# Patient Record
Sex: Male | Born: 1950 | Race: White | Hispanic: No | Marital: Married | Smoking: Former smoker
Health system: Southern US, Community
[De-identification: ages and names within clinical notes are randomized; demographics above are authoritative.]

## PROBLEM LIST (undated history)

## (undated) DIAGNOSIS — E291 Testicular hypofunction: Secondary | ICD-10-CM

## (undated) DIAGNOSIS — Z9889 Other specified postprocedural states: Secondary | ICD-10-CM

## (undated) DIAGNOSIS — J438 Other emphysema: Secondary | ICD-10-CM

## (undated) DIAGNOSIS — N200 Calculus of kidney: Secondary | ICD-10-CM

## (undated) DIAGNOSIS — J45909 Unspecified asthma, uncomplicated: Secondary | ICD-10-CM

## (undated) DIAGNOSIS — F5101 Primary insomnia: Secondary | ICD-10-CM

## (undated) DIAGNOSIS — G4700 Insomnia, unspecified: Secondary | ICD-10-CM

## (undated) DIAGNOSIS — R112 Nausea with vomiting, unspecified: Secondary | ICD-10-CM

## (undated) DIAGNOSIS — E782 Mixed hyperlipidemia: Secondary | ICD-10-CM

## (undated) HISTORY — PX: OTHER SURGICAL HISTORY: SHX169

## (undated) HISTORY — DX: Mixed hyperlipidemia: E78.2

## (undated) HISTORY — DX: Primary insomnia: F51.01

## (undated) HISTORY — DX: Other emphysema: J43.8

## (undated) HISTORY — DX: Testicular hypofunction: E29.1

## (undated) HISTORY — DX: Insomnia, unspecified: G47.00

---

## 2008-06-23 ENCOUNTER — Ambulatory Visit (HOSPITAL_COMMUNITY): Admission: RE | Admit: 2008-06-23 | Discharge: 2008-06-24 | Payer: Self-pay | Admitting: Orthopedic Surgery

## 2010-11-27 NOTE — Op Note (Signed)
[name], [name]             ACCOUNT NO.:  [id number]   MEDICAL RECORD NO.:  [id number]          PATIENT TYPE:  AMB   LOCATION:  DAY                          FACILITY:  [facility]   PHYSICIAN:  Marlowe Kays, M.D.  DATE OF BIRTH:  10-Jun-1951   DATE OF PROCEDURE:  06/23/2008  DATE OF DISCHARGE:                               OPERATIVE REPORT   PREOPERATIVE DIAGNOSES:  1. Completely torn, retracted rotator cuff tear.  2. Advanced hypertrophic arthritis, acromioclavicular joint.  3. Suspected partial biceps and labral tears, left shoulder.   POSTOPERATIVE DIAGNOSES:  1. Completely torn, retracted rotator cuff tear.  2. Advanced hypertrophic arthritis, acromioclavicular joint.  3. Partial biceps and labral tears, left shoulder.   OPERATION:  1. Left shoulder arthroscopy with debridement of small tear of intra-      articular biceps tendon and shaving of labrum.  2. Open distal clavicle resection.  3. Open anterior acromionectomy with repair of torn rotator cuff, see      operative description below for additional details.   PROCEDURE:  Prophylactic antibiotics, satisfactory general anesthesia on  the Allen frame, left shoulder girdle was prepped with DuraPrep, draped  in sterile field.  Time-out performed.  Anatomy of the shoulder joint  was marked out and posterior portal as well as proposed incision for the  distal clavicle resection, rotator cuff repair were all infiltrated 0.5%  Marcaine with adrenalin.  Through a posterior soft-spot portal I  atraumatically entered the glenohumeral joint.  There was a large amount  of joint fluid present as well as a reactive synovitis.  There was a  small area of minimal tear of the biceps tendon and some very slight  labral disruption of the biceps anchor.  He had a good bit of wear of  the humeral head and glenoid and the humeral head was slightly subluxed.  I advanced the scope between the biceps tendon and subscapularis and  using a  switching stick we made an anterior incision over which I placed  a metal cannula and introduced a 4.2 shaver into the joint.  I then  debrided down the small tear in the biceps tendon and the labrum.  Pre  and post films were taken.   I then proceeded to make an open incision over the distal clavicle with  subperiosteal dissection and cutting cautery, demarcated the [facility] joint  and marked 1.5 cm medial to it.  I then undermined the clavicle at this  point and placed baby Hohmann retractors and using a micro-saw, cut the  clavicle.  I then removed it with towel clip and cautery technique.  Small spicules of bone in the parent clavicle I removed with a small  rongeur and rasp and I also smoothed down the underneath surface of the  adjacent acromion.  I then placed bone wax on the raw bone and continued  my incision lateralward, first performed an anterior acromionectomy in  the usual fashion with micro-saw and protecting the underneath surface  with a Cobb elevator.  I then had to remove additional underneath  surface bone for both adequate decompression and also visualization.  I  identified the biceps tendon which was intact with no evidence for any  partial tear.  The rotator cuff tear was then demarcated.   There was a large flexible anterior flap going way back underneath the  acromion in a V shape with a small posterior component which I was able  to free up from the surrounding tissues with hemostat and scissors and  advanced slightly lateralward and anteriorly.  The tear was somewhat  laminated at the apex of the V and I repaired this lamination with  several interrupted horizontal mattress sutures of #1 Ethibond.  I then  used a Stryker 4 stranded anchor lateralward and placed the four strands  in symmetrical fashion along the anterior flap which brought it  posteriorly and then brought them beneath and came from underneath, up  through the posterior flap so I was able to cinch the  two halves  together with what appeared to be a very satisfactory closure, side-to-  side.  I then supplemented this with multiple interrupted sutures from  the remaining strands of the anchor.  I also supplemented the repair  lateralward.  This seemed to give a very nice stable repair with his arm  to his side.  I took pictures with the scope along the way.   The wound was then well-irrigated with sterile saline and once again I  infiltrated the two portals and the region of the wound  itself with  0.5% Marcaine with adrenalin.  The two portals were closed with 4-0  nylon.  I reapproximated the slight split in the deltoid muscle and the  fascia over the distal clavicle resection and over the rotator cuff with  interrupted #1 Vicryl.  Subcutaneous tissue was closed with a  combination of #1 and 2-0 Vicryl with Steri-Strips on skin.  Dry sterile  dressing was applied to all wounds with Betadine and Adaptic to the  portals.  He was then placed in a shoulder immobilizer, tolerated the  procedure well, was taken to recovery in satisfactory condition with no  known complications and minimal blood loss.           ______________________________  Marlowe Kays, M.D.     JA/MEDQ  D:  06/23/2008  T:  06/24/2008  Job:  [id number]

## 2012-05-28 ENCOUNTER — Other Ambulatory Visit: Payer: Self-pay | Admitting: Orthopedic Surgery

## 2012-06-02 ENCOUNTER — Encounter (HOSPITAL_COMMUNITY): Payer: Self-pay | Admitting: Pharmacy Technician

## 2012-06-05 ENCOUNTER — Encounter (HOSPITAL_COMMUNITY)
Admission: RE | Admit: 2012-06-05 | Discharge: 2012-06-05 | Disposition: A | Discharge status: Home / Self Care | Payer: PRIVATE HEALTH INSURANCE | Source: Ambulatory Visit | Attending: Orthopedic Surgery | Admitting: Orthopedic Surgery

## 2012-06-05 ENCOUNTER — Encounter (HOSPITAL_COMMUNITY): Payer: Self-pay

## 2012-06-05 HISTORY — DX: Unspecified asthma, uncomplicated: J45.909

## 2012-06-05 HISTORY — DX: Other specified postprocedural states: Z98.890

## 2012-06-05 HISTORY — DX: Calculus of kidney: N20.0

## 2012-06-05 HISTORY — DX: Other specified postprocedural states: R11.2

## 2012-06-05 LAB — CBC
MCH: 29.5 pg (ref 26.0–34.0)
MCV: 85.7 fL (ref 78.0–100.0)
Platelets: 323 10*3/uL (ref 150–400)
RBC: 5.02 MIL/uL (ref 4.22–5.81)

## 2012-06-05 NOTE — Progress Notes (Signed)
Note routed by epic to Dr. Simonne Come, ancef ordered and patient has penicillin allergy.

## 2012-06-05 NOTE — Progress Notes (Signed)
Pt states unknown reaction to penicillin as child, ancef is ordered. Do you wish to change this?

## 2012-06-05 NOTE — Progress Notes (Signed)
Nurse Zella Ball from Dr. Leim Fabry office called, no change in antibiotic.

## 2012-06-05 NOTE — Patient Instructions (Addendum)
20 JOSEGUADALUPE STAN  06/05/2012   Your procedure is scheduled on:  06/10/12   Wednesday   Surgery 1030-1230  Report to [facility] at  0830     AM.  Call this number if you have problems the morning of surgery: [phone]      Remember: Marla Roe WITH YOU TO HOSPITAL  Do not eat food  Or drink :After Midnight.  Tuesday NIGHT   Take these medicines the morning of surgery with A SIP OF WATER:albuterol inhaler if needed and bring inhaler with you.  .  Contacts, dentures or partial plates can not be worn to surgery  Leave suitcase in the car. After surgery it may be brought to your room.  For patients admitted to the hospital, checkout time is 11:00 AM day of  discharge.             SPECIAL INSTRUCTIONS- SEE [facility] PREPARING FOR SURGERY INSTRUCTION SHEET-     DO NOT WEAR JEWELRY, LOTIONS, POWDERS, OR PERFUMES.  WOMEN-- DO NOT SHAVE LEGS OR UNDERARMS FOR 12 HOURS BEFORE SHOWERS. MEN MAY SHAVE FACE.                                                                            Please read over the following fact sheets that you were given: MRSA Information, Incentive Spirometry Sheet, Blood Transfusion Sheet  Information                                                                                   Fleet Contras Rashan Patient  PST [phone]  [phone]

## 2012-06-10 ENCOUNTER — Encounter (HOSPITAL_COMMUNITY): Payer: Self-pay | Admitting: *Deleted

## 2012-06-10 ENCOUNTER — Encounter (HOSPITAL_COMMUNITY): Payer: Self-pay | Admitting: Anesthesiology

## 2012-06-10 ENCOUNTER — Encounter (HOSPITAL_COMMUNITY): Payer: Self-pay

## 2012-06-10 ENCOUNTER — Ambulatory Visit (HOSPITAL_COMMUNITY)
Admission: RE | Admit: 2012-06-10 | Discharge: 2012-06-11 | Disposition: A | Discharge status: Home / Self Care | Payer: PRIVATE HEALTH INSURANCE | Source: Ambulatory Visit | Attending: Orthopedic Surgery | Admitting: Orthopedic Surgery

## 2012-06-10 ENCOUNTER — Ambulatory Visit (HOSPITAL_COMMUNITY): Payer: PRIVATE HEALTH INSURANCE | Admitting: Anesthesiology

## 2012-06-10 ENCOUNTER — Encounter (HOSPITAL_COMMUNITY)
Admission: RE | Disposition: A | Discharge status: Home / Self Care | Payer: Self-pay | Source: Ambulatory Visit | Attending: Orthopedic Surgery

## 2012-06-10 DIAGNOSIS — M719 Bursopathy, unspecified: Secondary | ICD-10-CM | POA: Insufficient documentation

## 2012-06-10 DIAGNOSIS — M67919 Unspecified disorder of synovium and tendon, unspecified shoulder: Secondary | ICD-10-CM | POA: Insufficient documentation

## 2012-06-10 DIAGNOSIS — M24119 Other articular cartilage disorders, unspecified shoulder: Secondary | ICD-10-CM | POA: Insufficient documentation

## 2012-06-10 DIAGNOSIS — Z79899 Other long term (current) drug therapy: Secondary | ICD-10-CM | POA: Insufficient documentation

## 2012-06-10 DIAGNOSIS — J45909 Unspecified asthma, uncomplicated: Secondary | ICD-10-CM | POA: Insufficient documentation

## 2012-06-10 DIAGNOSIS — Z01812 Encounter for preprocedural laboratory examination: Secondary | ICD-10-CM | POA: Insufficient documentation

## 2012-06-10 DIAGNOSIS — Z01810 Encounter for preprocedural cardiovascular examination: Secondary | ICD-10-CM | POA: Insufficient documentation

## 2012-06-10 DIAGNOSIS — M75101 Unspecified rotator cuff tear or rupture of right shoulder, not specified as traumatic: Secondary | ICD-10-CM

## 2012-06-10 HISTORY — PX: SHOULDER ARTHROSCOPY WITH OPEN ROTATOR CUFF REPAIR AND DISTAL CLAVICLE ACROMINECTOMY: SHX5683

## 2012-06-10 SURGERY — SHOULDER ARTHROSCOPY WITH OPEN ROTATOR CUFF REPAIR AND DISTAL CLAVICLE ACROMINECTOMY
Anesthesia: Regional | Site: Shoulder | Laterality: Right | Wound class: Clean

## 2012-06-10 MED ORDER — GLYCOPYRROLATE 0.2 MG/ML IJ SOLN
INTRAMUSCULAR | Status: DC | PRN
  Administered 2012-06-10: 0.4 mg via INTRAVENOUS

## 2012-06-10 MED ORDER — CEFAZOLIN SODIUM-DEXTROSE 2-3 GM-% IV SOLR
2.0000 g | INTRAVENOUS | Status: AC
  Administered 2012-06-10: 2 g via INTRAVENOUS

## 2012-06-10 MED ORDER — DEXAMETHASONE SODIUM PHOSPHATE 10 MG/ML IJ SOLN
INTRAMUSCULAR | Status: DC | PRN
  Administered 2012-06-10: 10 mg via INTRAVENOUS

## 2012-06-10 MED ORDER — LACTATED RINGERS IV SOLN
INTRAVENOUS | Status: DC
  Administered 2012-06-10: 1000 mL via INTRAVENOUS
  Administered 2012-06-10: 12:00:00 via INTRAVENOUS

## 2012-06-10 MED ORDER — METOCLOPRAMIDE HCL 10 MG PO TABS: 5.0000 mg | ORAL_TABLET | Freq: Three times a day (TID) | ORAL | Status: DC | PRN

## 2012-06-10 MED ORDER — OXYCODONE-ACETAMINOPHEN 5-325 MG PO TABS
1.0000 | ORAL_TABLET | ORAL | Status: DC | PRN
  Administered 2012-06-10 – 2012-06-11 (×2): 1 via ORAL
  Filled 2012-06-10 (×4): qty 1

## 2012-06-10 MED ORDER — MIDAZOLAM HCL 2 MG/2ML IJ SOLN
INTRAMUSCULAR | Status: AC
  Filled 2012-06-10: qty 2

## 2012-06-10 MED ORDER — CEFAZOLIN SODIUM-DEXTROSE 2-3 GM-% IV SOLR
INTRAVENOUS | Status: AC
  Filled 2012-06-10: qty 50

## 2012-06-10 MED ORDER — SCOPOLAMINE 1 MG/3DAYS TD PT72
MEDICATED_PATCH | TRANSDERMAL | Status: AC
  Filled 2012-06-10: qty 1

## 2012-06-10 MED ORDER — ONDANSETRON HCL 4 MG PO TABS: 4.0000 mg | ORAL_TABLET | Freq: Four times a day (QID) | ORAL | Status: DC | PRN

## 2012-06-10 MED ORDER — LIDOCAINE HCL (CARDIAC) 20 MG/ML IV SOLN
INTRAVENOUS | Status: DC | PRN
  Administered 2012-06-10: 50 mg via INTRAVENOUS

## 2012-06-10 MED ORDER — DEXTROSE-NACL 5-0.45 % IV SOLN
INTRAVENOUS | Status: DC
  Administered 2012-06-10 (×2): via INTRAVENOUS

## 2012-06-10 MED ORDER — MIDAZOLAM HCL 2 MG/2ML IJ SOLN
1.0000 mg | INTRAMUSCULAR | Status: DC | PRN
  Administered 2012-06-10: 2 mg via INTRAVENOUS

## 2012-06-10 MED ORDER — ACETAMINOPHEN 10 MG/ML IV SOLN
INTRAVENOUS | Status: AC
  Filled 2012-06-10: qty 100

## 2012-06-10 MED ORDER — ACETAMINOPHEN 10 MG/ML IV SOLN
INTRAVENOUS | Status: DC | PRN
  Administered 2012-06-10: 1000 mg via INTRAVENOUS

## 2012-06-10 MED ORDER — EPINEPHRINE HCL 1 MG/ML IJ SOLN
INTRAMUSCULAR | Status: AC
  Filled 2012-06-10: qty 2

## 2012-06-10 MED ORDER — EPINEPHRINE HCL 1 MG/ML IJ SOLN
INTRAMUSCULAR | Status: DC | PRN
  Administered 2012-06-10: 1 mg

## 2012-06-10 MED ORDER — POVIDONE-IODINE 7.5 % EX SOLN: Freq: Once | CUTANEOUS | Status: DC

## 2012-06-10 MED ORDER — ALBUTEROL SULFATE HFA 108 (90 BASE) MCG/ACT IN AERS: 1.0000 | INHALATION_SPRAY | Freq: Four times a day (QID) | RESPIRATORY_TRACT | Status: DC | PRN

## 2012-06-10 MED ORDER — FENTANYL CITRATE 0.05 MG/ML IJ SOLN
100.0000 ug | INTRAMUSCULAR | Status: DC | PRN
  Administered 2012-06-10: 100 ug via INTRAVENOUS

## 2012-06-10 MED ORDER — PHENYLEPHRINE HCL 10 MG/ML IJ SOLN
INTRAMUSCULAR | Status: DC | PRN
  Administered 2012-06-10 (×4): 80 ug via INTRAVENOUS

## 2012-06-10 MED ORDER — SUCCINYLCHOLINE CHLORIDE 20 MG/ML IJ SOLN
INTRAMUSCULAR | Status: DC | PRN
  Administered 2012-06-10: 100 mg via INTRAVENOUS

## 2012-06-10 MED ORDER — ROPIVACAINE HCL 5 MG/ML IJ SOLN: INTRAMUSCULAR | Status: DC | PRN

## 2012-06-10 MED ORDER — HETASTARCH-ELECTROLYTES 6 % IV SOLN
INTRAVENOUS | Status: DC | PRN
  Administered 2012-06-10: 11:00:00 via INTRAVENOUS

## 2012-06-10 MED ORDER — METHOCARBAMOL 500 MG PO TABS
500.0000 mg | ORAL_TABLET | Freq: Four times a day (QID) | ORAL | Status: DC | PRN
  Administered 2012-06-11: 500 mg via ORAL
  Filled 2012-06-10: qty 1

## 2012-06-10 MED ORDER — PROMETHAZINE HCL 25 MG/ML IJ SOLN: 6.2500 mg | INTRAMUSCULAR | Status: DC | PRN

## 2012-06-10 MED ORDER — LACTATED RINGERS IR SOLN
Status: DC | PRN
  Administered 2012-06-10 (×2): 3000 mL

## 2012-06-10 MED ORDER — LIDOCAINE HCL 4 % MT SOLN
OROMUCOSAL | Status: DC | PRN
  Administered 2012-06-10: 4 mL via TOPICAL

## 2012-06-10 MED ORDER — ROPIVACAINE HCL 5 MG/ML IJ SOLN
INTRAMUSCULAR | Status: DC | PRN
  Administered 2012-06-10: 25 mL via EPIDURAL

## 2012-06-10 MED ORDER — ONDANSETRON HCL 4 MG/2ML IJ SOLN: 4.0000 mg | Freq: Four times a day (QID) | INTRAMUSCULAR | Status: DC | PRN

## 2012-06-10 MED ORDER — ROPIVACAINE HCL 5 MG/ML IJ SOLN
INTRAMUSCULAR | Status: AC
  Filled 2012-06-10: qty 30

## 2012-06-10 MED ORDER — ONDANSETRON HCL 4 MG/2ML IJ SOLN
INTRAMUSCULAR | Status: DC | PRN
  Administered 2012-06-10: 4 mg via INTRAVENOUS

## 2012-06-10 MED ORDER — BUPIVACAINE-EPINEPHRINE 0.5% -1:200000 IJ SOLN
INTRAMUSCULAR | Status: DC | PRN
  Administered 2012-06-10: 30 mL

## 2012-06-10 MED ORDER — FENTANYL CITRATE 0.05 MG/ML IJ SOLN
INTRAMUSCULAR | Status: AC
  Filled 2012-06-10: qty 2

## 2012-06-10 MED ORDER — LACTATED RINGERS IV SOLN: INTRAVENOUS | Status: DC

## 2012-06-10 MED ORDER — HYDROMORPHONE HCL PF 1 MG/ML IJ SOLN: 0.2500 mg | INTRAMUSCULAR | Status: DC | PRN

## 2012-06-10 MED ORDER — NEOSTIGMINE METHYLSULFATE 1 MG/ML IJ SOLN
INTRAMUSCULAR | Status: DC | PRN
  Administered 2012-06-10: 3 mg via INTRAVENOUS

## 2012-06-10 MED ORDER — MORPHINE SULFATE 2 MG/ML IJ SOLN: 1.0000 mg | INTRAMUSCULAR | Status: DC | PRN

## 2012-06-10 MED ORDER — PROPOFOL 10 MG/ML IV BOLUS
INTRAVENOUS | Status: DC | PRN
  Administered 2012-06-10: 160 mg via INTRAVENOUS
  Administered 2012-06-10: 40 mg via INTRAVENOUS

## 2012-06-10 MED ORDER — BUPIVACAINE-EPINEPHRINE (PF) 0.5% -1:200000 IJ SOLN
INTRAMUSCULAR | Status: AC
  Filled 2012-06-10: qty 10

## 2012-06-10 MED ORDER — METOCLOPRAMIDE HCL 5 MG/ML IJ SOLN: 5.0000 mg | Freq: Three times a day (TID) | INTRAMUSCULAR | Status: DC | PRN

## 2012-06-10 MED ORDER — ROCURONIUM BROMIDE 100 MG/10ML IV SOLN
INTRAVENOUS | Status: DC | PRN
  Administered 2012-06-10: 5 mg via INTRAVENOUS

## 2012-06-10 MED ORDER — CEFAZOLIN SODIUM-DEXTROSE 2-3 GM-% IV SOLR
2.0000 g | Freq: Four times a day (QID) | INTRAVENOUS | Status: AC
  Administered 2012-06-10 – 2012-06-11 (×3): 2 g via INTRAVENOUS
  Filled 2012-06-10 (×3): qty 50

## 2012-06-10 MED ORDER — METHOCARBAMOL 100 MG/ML IJ SOLN
500.0000 mg | Freq: Four times a day (QID) | INTRAVENOUS | Status: DC | PRN
  Administered 2012-06-10: 500 mg via INTRAVENOUS
  Filled 2012-06-10: qty 5

## 2012-06-10 MED ORDER — SUFENTANIL CITRATE 50 MCG/ML IV SOLN
INTRAVENOUS | Status: DC | PRN
  Administered 2012-06-10 (×2): 10 ug via INTRAVENOUS

## 2012-06-10 SURGICAL SUPPLY — 52 items
ANCHOR PEEK ZIP 5.5 NDL NO2 (Orthopedic Implant) ×2 IMPLANT
BENZOIN TINCTURE PRP APPL 2/3 (GAUZE/BANDAGES/DRESSINGS) ×2 IMPLANT
BLADE 4.2CUDA (BLADE) ×2 IMPLANT
BLADE OSCILLATING/SAGITTAL (BLADE) ×1
BLADE SURG SZ11 CARB STEEL (BLADE) ×2 IMPLANT
BLADE SW THK.38XMED LNG THN (BLADE) ×1 IMPLANT
BOOTIES KNEE HIGH SLOAN (MISCELLANEOUS) ×2 IMPLANT
BUR OVAL 4.0 (BURR) ×4 IMPLANT
CLOTH BEACON ORANGE TIMEOUT ST (SAFETY) ×2 IMPLANT
DRAPE LG THREE QUARTER DISP (DRAPES) ×2 IMPLANT
DRAPE SHOULDER BEACH CHAIR (DRAPES) ×2 IMPLANT
DRAPE U-SHAPE 47X51 STRL (DRAPES) ×2 IMPLANT
DRSG ADAPTIC 3X8 NADH LF (GAUZE/BANDAGES/DRESSINGS) ×2 IMPLANT
DRSG EMULSION OIL 3X16 NADH (GAUZE/BANDAGES/DRESSINGS) ×2 IMPLANT
DRSG PAD ABDOMINAL 8X10 ST (GAUZE/BANDAGES/DRESSINGS) ×2 IMPLANT
DURAPREP 26ML APPLICATOR (WOUND CARE) ×2 IMPLANT
ELECT KIT MENISCUS BASIC 165 (SET/KITS/TRAYS/PACK) IMPLANT
ELECT REM PT RETURN 9FT ADLT (ELECTROSURGICAL) ×2
ELECTRODE REM PT RTRN 9FT ADLT (ELECTROSURGICAL) ×1 IMPLANT
GLOVE BIOGEL PI IND STRL 8 (GLOVE) ×1 IMPLANT
GLOVE BIOGEL PI INDICATOR 8 (GLOVE) ×1
GLOVE ECLIPSE 8.0 STRL XLNG CF (GLOVE) ×4 IMPLANT
GLOVE INDICATOR 8.0 STRL GRN (GLOVE) ×4 IMPLANT
GOWN STRL REIN XL XLG (GOWN DISPOSABLE) ×4 IMPLANT
KIT BASIN OR (CUSTOM PROCEDURE TRAY) ×2 IMPLANT
KIT POSITION SHOULDER SCHLEI (MISCELLANEOUS) ×2 IMPLANT
MANIFOLD NEPTUNE II (INSTRUMENTS) ×2 IMPLANT
NDL SAFETY ECLIPSE 18X1.5 (NEEDLE) ×1 IMPLANT
NEEDLE HYPO 18GX1.5 SHARP (NEEDLE) ×1
NEEDLE MA TROC 1/2 (NEEDLE) ×2 IMPLANT
NEEDLE MA TROC 1/2 CIR (NEEDLE) IMPLANT
NEEDLE SPNL 18GX3.5 QUINCKE PK (NEEDLE) ×2 IMPLANT
PACK SHOULDER CUSTOM OPM052 (CUSTOM PROCEDURE TRAY) ×2 IMPLANT
POSITIONER SURGICAL ARM (MISCELLANEOUS) ×4 IMPLANT
SET ARTHROSCOPY TUBING (MISCELLANEOUS) ×1
SET ARTHROSCOPY TUBING LN (MISCELLANEOUS) ×1 IMPLANT
SLING ARM IMMOBILIZER LRG (SOFTGOODS) ×2 IMPLANT
SLING ARM IMMOBILIZER MED (SOFTGOODS) IMPLANT
SPONGE GAUZE 4X4 12PLY (GAUZE/BANDAGES/DRESSINGS) ×2 IMPLANT
STAPLER VISISTAT (STAPLE) ×2 IMPLANT
STRIP CLOSURE SKIN 1/2X4 (GAUZE/BANDAGES/DRESSINGS) IMPLANT
SUCTION FRAZIER TIP 10 FR DISP (SUCTIONS) ×2 IMPLANT
SUT BONE WAX W31G (SUTURE) ×2 IMPLANT
SUT ETHIBOND NAB CT1 #1 30IN (SUTURE) ×2 IMPLANT
SUT ETHILON 4 0 PS 2 18 (SUTURE) ×2 IMPLANT
SUT VIC AB 1 CT1 27 (SUTURE)
SUT VIC AB 1 CT1 27XBRD ANTBC (SUTURE) IMPLANT
SUT VIC AB 2-0 CT1 27 (SUTURE) ×1
SUT VIC AB 2-0 CT1 27XBRD (SUTURE) ×1 IMPLANT
SYR 3ML LL SCALE MARK (SYRINGE) ×2 IMPLANT
TUBING CONNECTING 10 (TUBING) ×2 IMPLANT
WAND 90 DEG TURBOVAC W/CORD (SURGICAL WAND) ×2 IMPLANT

## 2012-06-10 NOTE — Brief Op Note (Signed)
06/10/2012  2:12 PM  PATIENT:  Mechele Collin  61 y.o. male  PRE-OPERATIVE DIAGNOSIS:  RIGHT SHOULDER REOCCURRENCE ROTATOR CUFF TEAR  POST-OPERATIVE DIAGNOSIS:  labral and rotator cuff tear right shoulder  PROCEDURE:  Procedure(s) (LRB) with comments: SHOULDER ARTHROSCOPY WITH OPEN ROTATOR CUFF REPAIR AND DISTAL CLAVICLE ACROMINECTOMY (Right) - LABRAL DEBRIDEMENT OPEN ANTERIOR ACROMINECTOMY/ROTATOR CUFF REPAIR with anchor  SURGEON:  Surgeon(s) and Role:    * Drucilla Schmidt, MD - Primary  PHYSICIAN ASSISTANT: Skip Mayer [facility]  ASSISTANTS:nurse  ANESTHESIA:   regional and general  EBL:  Total I/O In: 2450 [I.V.:1900; IV Piggyback:550] Out: 50 [Blood:50]  BLOOD ADMINISTERED:none  DRAINS: none   LOCAL MEDICATIONS USED:  XYLOCAINE   SPECIMEN:  No Specimen  DISPOSITION OF SPECIMEN:  N/A  COUNTS:  YES  TOURNIQUET:  * No tourniquets in log *  DICTATION: .Other Dictation: Dictation Number [id number]  PLAN OF CARE: Admit for overnight observation  PATIENT DISPOSITION:  PACU - hemodynamically stable.   Delay start of Pharmacological VTE agent (>24hrs) due to surgical blood loss or risk of bleeding: yes

## 2012-06-10 NOTE — Anesthesia Procedure Notes (Signed)
Anesthesia Regional Block:  Interscalene brachial plexus block  Pre-Anesthetic Checklist: ,, timeout performed, Correct Patient, Correct Site, Correct Laterality, Correct Procedure, Correct Position, site marked, Risks and benefits discussed,  Surgical consent,  Pre-op evaluation,  At surgeon's request and post-op pain management  Laterality: Right  Prep: chloraprep       Needles:  Injection technique: Single-shot  Needle Type: Stimiplex          Additional Needles:  Procedures: ultrasound guided (picture in chart) Interscalene brachial plexus block Narrative:  Start time: 06/10/2012 10:05 AM End time: 06/10/2012 10:12 AM Injection made incrementally with aspirations every 5 mL.  Performed by: Personally  Anesthesiologist: A.Obediah Welles MD  Interscalene brachial plexus block

## 2012-06-10 NOTE — Anesthesia Postprocedure Evaluation (Signed)
Anesthesia Post Note  Patient: [name]  Procedure(s) Performed: Procedure(s) (LRB): SHOULDER ARTHROSCOPY WITH OPEN ROTATOR CUFF REPAIR AND DISTAL CLAVICLE ACROMINECTOMY (Right)  Anesthesia type: General  Patient location: PACU  Post pain: Pain level controlled  Post assessment: Post-op Vital signs reviewed  Last Vitals:  Filed Vitals:   06/10/12 1345  BP: 127/73  Pulse: 88  Temp:   Resp: 22    Post vital signs: Reviewed  Level of consciousness: sedated  Complications: No apparent anesthesia complications

## 2012-06-10 NOTE — H&P (Signed)
[name] is an 61 y.o. male.   Chief Complaint:painful rt shoulder HPI:MRI demonstrates torn labrum and torn retracted rotator cuff tear  Past Medical History  Diagnosis Date  . Asthma     mild, no recent flare ups  . Kidney stones 13 yrs ago  . PONV (postoperative nausea and vomiting)     Past Surgical History  Procedure Date  . Bilateral rotator cuff repair right 2004, left 2009  . Left ankle fx x 2     History reviewed. No pertinent family history. Social History:  reports that he has quit smoking. His smoking use included Cigarettes. He has a .75 pack-year smoking history. He has never used smokeless tobacco. He reports that he drinks alcohol. He reports that he does not use illicit drugs.  Allergies:  Allergies  Allergen Reactions  . Penicillins Other (See Comments)    Unknown reaction,    Medications Prior to Admission  Medication Sig Dispense Refill  . amoxicillin-clavulanate (AUGMENTIN) 125-31.25 MG/5ML suspension Take by mouth 2 (two) times daily.      Marland Kitchen albuterol (PROVENTIL HFA;VENTOLIN HFA) 108 (90 BASE) MCG/ACT inhaler Inhale 1 puff into the lungs every 6 (six) hours as needed. Wheezing and shortness of breath      . glucosamine-chondroitin 500-400 MG tablet Take 1 tablet by mouth 3 (three) times daily.      Marland Kitchen lovastatin (MEVACOR) 20 MG tablet Take 20 mg by mouth at bedtime.      . Multiple Vitamin (MULTIVITAMIN WITH MINERALS) TABS Take 1 tablet by mouth daily.      . Omega-3 Fatty Acids (FISH OIL) 1200 MG CAPS Take 1 capsule by mouth daily.      . vitamin C (ASCORBIC ACID) 500 MG tablet Take 500 mg by mouth daily.      . vitamin E (VITAMIN E) 400 UNIT capsule Take 400 Units by mouth daily.        No results found for this or any previous visit (from the past 48 hour(s)). No results found.  ROS  Blood pressure 113/75, pulse 69, temperature 97.9 F (36.6 C), temperature source Oral, resp. rate 18, SpO2 99.00%. Physical Exam  Constitutional: He is  oriented to person, place, and time. He appears well-developed and well-nourished.  HENT:  Head: Normocephalic and atraumatic.  Right Ear: External ear normal.  Left Ear: External ear normal.  Nose: Nose normal.  Mouth/Throat: Oropharynx is clear and moist.  Eyes: Conjunctivae normal and EOM are normal. Pupils are equal, round, and reactive to light.  Neck: Normal range of motion. Neck supple.  Cardiovascular: Normal rate, regular rhythm, normal heart sounds and intact distal pulses.   Respiratory: Effort normal and breath sounds normal.  GI: Soft. Bowel sounds are normal.  Musculoskeletal: Normal range of motion. He exhibits tenderness.       Tender rt shoulder  Neurological: He is alert and oriented to person, place, and time. He has normal reflexes.  Skin: Skin is warm and dry.  Psychiatric: He has a normal mood and affect. His behavior is normal. Judgment and thought content normal.     Assessment/Plan Torn labrum and rotator cuff rt shoulder Rt shoulder arthroscopy with labral debridement and open anterior acromionectomy with rotator cuff repair  [name] P 06/10/2012, 9:51 AM

## 2012-06-10 NOTE — Anesthesia Preprocedure Evaluation (Signed)
Anesthesia Evaluation    History of Anesthesia Complications (+) PONV  Airway Mallampati: II TM Distance: >3 FB Neck ROM: Full    Dental  (+) Teeth Intact and Dental Advisory Given   Pulmonary asthma , former smoker,  breath sounds clear to auscultation  Pulmonary exam normal       Cardiovascular Rhythm:Regular Rate:Normal     Neuro/Psych    GI/Hepatic   Endo/Other  Morbid obesity  Renal/GU Renal disease     Musculoskeletal   Abdominal   Peds  Hematology   Anesthesia Other Findings   Reproductive/Obstetrics                           Anesthesia Physical Anesthesia Plan  ASA: II  Anesthesia Plan: General and Regional   Post-op Pain Management:    Induction: Intravenous  Airway Management Planned: Oral ETT  Additional Equipment:   Intra-op Plan:   Post-operative Plan: Extubation in OR  Informed Consent: I have reviewed the patients History and Physical, chart, labs and discussed the procedure including the risks, benefits and alternatives for the proposed anesthesia with the patient or authorized representative who has indicated his/her understanding and acceptance.   Dental advisory given  Plan Discussed with: CRNA  Anesthesia Plan Comments:         Anesthesia Quick Evaluation

## 2012-06-10 NOTE — Transfer of Care (Signed)
Immediate Anesthesia Transfer of Care Note  Patient: [name]  Procedure(s) Performed: Procedure(s) (LRB) with comments: SHOULDER ARTHROSCOPY WITH OPEN ROTATOR CUFF REPAIR AND DISTAL CLAVICLE ACROMINECTOMY (Right) - LABRAL DEBRIDEMENT OPEN ANTERIOR ACROMINECTOMY/ROTATOR CUFF REPAIR with anchor  Patient Location: PACU  Anesthesia Type:General  Level of Consciousness: awake, alert , oriented, patient cooperative and responds to stimulation  Airway & Oxygen Therapy: Patient Spontanous Breathing and Patient connected to face mask oxygen  Post-op Assessment: Report given to PACU RN, Post -op Vital signs reviewed and stable and Patient moving all extremities X 4  Post vital signs: stable  Complications: No apparent anesthesia complications

## 2012-06-11 MED ORDER — HYDROCODONE-ACETAMINOPHEN 10-325 MG PO TABS: 1.0000 | ORAL_TABLET | Freq: Four times a day (QID) | ORAL | Status: DC | PRN

## 2012-06-11 MED ORDER — METHOCARBAMOL 500 MG PO TABS: 500.0000 mg | ORAL_TABLET | Freq: Four times a day (QID) | ORAL | Status: DC | PRN

## 2012-06-11 NOTE — Op Note (Signed)
[name], [name]             ACCOUNT NO.:  [id number]  MEDICAL RECORD NO.:  [id number]  LOCATION:  1614                         FACILITY:  [facility]  PHYSICIAN:  Marlowe Kays, M.D.  DATE OF BIRTH:  1951-04-07  DATE OF PROCEDURE:  06/10/2012 DATE OF DISCHARGE:                              OPERATIVE REPORT   PREOPERATIVE DIAGNOSES: 1. Recurrent rotator cuff tear. 2. Labral degenerative tear, right shoulder.  POSTOPERATIVE DIAGNOSES: 1. Recurrent rotator cuff tear. 2. Labral degenerative tear, right shoulder.  OPERATION: 1. Right shoulder arthroscopy with debridement of labrum and articular     surface of rotator cuff. 2. Open anterior acromionectomy with repair of recurrent rotator cuff     tear.  SURGEON:  Marlowe Kays, M.D.  ASSISTANTLaural Benes. Jannet Mantis.  ANESTHESIA:  Interscalene block followed by general anesthesia.  PATHOLOGY AND JUSTIFICATION FOR PROCEDURE:  Original repair with some 9 years ago elsewhere and he has done well until recently when after working out with weights and noted progressive pain in his right shoulder.  An MRI demonstrated a complete rotator cuff tear with roughly 5.5 cm of tendon retraction.  PROCEDURE:  Prophylactic antibiotics, satisfied general anesthesia after anesthesia had performed and interscalene block, placed on the sliding frame in the beach-chair position.  Right shoulder girdle was prepped with DuraPrep, draped in sterile field.  Time-out was performed. Anatomy of the shoulder joint was marked out and site for the posterior portal and the skin incision were injected with Marcaine with adrenaline for hemostatic purposes.  Through posterior soft spot portal, I atraumatically entered the glenohumeral joint with findings good bit of fraying of the rotator cuff and the degenerative tearing of the labrum, particularly at the level of the biceps anchor.  The biceps itself appeared to have some disruption with healing in the  past, but was intact.  I advanced the scope between the biceps tendon and subscapularis, and using a switching stick, made an anterior incision, over which, I placed a metal cannula over the switching stick and entered into the joint anteriorly.  I used a 4.2 shaver to debride down the labrum and trim up the rotator cuff fibers, which may have been affecting the joint mechanics.  Pre and post-films were taken.  I then removed all fluid from the glenohumeral joint and placed a staples in the two portals.  I then made an open incision over the anterior acromion and with cautery subperiosteal dissection.  I isolated the anterior acromion.  I placed a small Cobb elevator followed by larger Cobb elevator beneath the acromion and then made my initial anterior acromionectomy with a subsequent second minor cut of a few millimeter to allow for more exposure and decompression.  He had very thickened bursa, which I excised and allow me to confirm the tear noted on the MRI. Biceps tendon was noted to be intact.  There was a residual portion of the suture, which may have been what the radiologist felt was a small foreign body or ganglion at the infraspinatus myotendinous junction. After freeing up the rotator cuff, it was not only V shaved, but had laminated, so that large anterior flap had migrated anteriorly. Accordingly my first step  was to try and reestablish the 2 layers of rotator cuff, which had split and I brought the anterior flap posteriorly and sutured it down with two separate #1 Ethibond sutures. I then used a 4-stranded Stryker anchor after roughening up the greater tuberosity area and was able to spliced the 4 strands across the length of the tear, cinching it down laterally.  I then had terminal sutures as well with the same suture material, which really nicely tagged down the terminal end of the rotator cuff and this seemed to be stable with his arm to the side.  There was no residual  impingement on closure.  The wound was irrigated with sterile saline and I closed the fascia over the anterior acromion and the deltoid muscle with interrupted #1 Vicryl, subcutaneous tissue with 2-0 Vicryl, staples in the skin.  Betadine, Adaptic, dry sterile dressing, and shoulder immobilizer were applied. He tolerated the procedure well, was taken to the recovery room in satisfied condition with no known complications.          ______________________________ Marlowe Kays, M.D.     JA/MEDQ  D:  06/10/2012  T:  06/11/2012  Job:  [id number]

## 2012-06-11 NOTE — Progress Notes (Signed)
Patient ID: [name], male   DOB: 1951/01/12, 61 y.o.   MRN: [id number] Pod 1--block still partly in place.  He is urinating and is comfortable.  Plan dish today.  Office 12/2.

## 2012-06-16 NOTE — Progress Notes (Signed)
Utilization review completed.  

## 2012-06-18 NOTE — Discharge Summary (Signed)
[name], [name]             ACCOUNT NO.:  [id number]  MEDICAL RECORD NO.:  [id number]  LOCATION:  1614                         FACILITY:  [facility]  PHYSICIAN:  Marlowe Kays, M.D.  DATE OF BIRTH:  [birth year]  DATE OF ADMISSION:  06/10/2012 DATE OF DISCHARGE:  06/11/2012                              DISCHARGE SUMMARY   ADMITTING DIAGNOSES: 1. Recurrent rotator cuff tear, right shoulder. 2. Labral degenerative tear, right shoulder.  DISCHARGE DIAGNOSES: 1. Recurrent rotator cuff tear, right shoulder. 2. Labral degenerative tear, right shoulder.  OPERATION: 1. Right shoulder arthroscopy with debridement of labrum and articular     surface of rotator cuff. 2. Open anterior acromionectomy with repair of recurrent rotator cuff     tear.  SUMMARY:  This man is a longstanding patient of mine, I have done previous rotator cuff repair on his left shoulder.  Some 9 years ago, he had rotator cuff repair on his right shoulder elsewhere and recently has had working out with weights, progressive pain with an MRI demonstrating complete rotator cuff tear with retraction and a degenerative labral tear.  Accordingly, he is here for the above-mentioned surgery.  This was performed under general anesthesia supplemented with an interscalene block.  He had no postoperative complications and he was able to be discharge the next day, afebrile, with the interscalene block still slightly effective.  He was given prescriptions for Demerol and Robaxin, return to my office in 4-5 days for removal of dressing and to begin Betadine.  Condition at discharge was stable and improved.          ______________________________ Marlowe Kays, M.D.     JA/MEDQ  D:  06/17/2012  T:  06/17/2012  Job:  [id number]

## 2012-09-25 LAB — HM COLONOSCOPY

## 2016-05-16 DIAGNOSIS — L820 Inflamed seborrheic keratosis: Secondary | ICD-10-CM | POA: Diagnosis not present

## 2016-05-16 DIAGNOSIS — L578 Other skin changes due to chronic exposure to nonionizing radiation: Secondary | ICD-10-CM | POA: Diagnosis not present

## 2016-05-16 DIAGNOSIS — L821 Other seborrheic keratosis: Secondary | ICD-10-CM | POA: Diagnosis not present

## 2016-05-22 DIAGNOSIS — Z0000 Encounter for general adult medical examination without abnormal findings: Secondary | ICD-10-CM | POA: Diagnosis not present

## 2016-05-22 DIAGNOSIS — J45909 Unspecified asthma, uncomplicated: Secondary | ICD-10-CM | POA: Diagnosis not present

## 2016-05-22 DIAGNOSIS — E782 Mixed hyperlipidemia: Secondary | ICD-10-CM | POA: Diagnosis not present

## 2016-05-22 DIAGNOSIS — E291 Testicular hypofunction: Secondary | ICD-10-CM | POA: Diagnosis not present

## 2016-07-16 DIAGNOSIS — L578 Other skin changes due to chronic exposure to nonionizing radiation: Secondary | ICD-10-CM | POA: Diagnosis not present

## 2016-07-16 DIAGNOSIS — L570 Actinic keratosis: Secondary | ICD-10-CM | POA: Diagnosis not present

## 2016-07-16 DIAGNOSIS — L821 Other seborrheic keratosis: Secondary | ICD-10-CM | POA: Diagnosis not present

## 2016-08-07 DIAGNOSIS — Z79899 Other long term (current) drug therapy: Secondary | ICD-10-CM | POA: Diagnosis not present

## 2016-08-07 DIAGNOSIS — Z6831 Body mass index (BMI) 31.0-31.9, adult: Secondary | ICD-10-CM | POA: Diagnosis not present

## 2016-08-07 DIAGNOSIS — Z0000 Encounter for general adult medical examination without abnormal findings: Secondary | ICD-10-CM | POA: Diagnosis not present

## 2016-08-07 DIAGNOSIS — Z87891 Personal history of nicotine dependence: Secondary | ICD-10-CM | POA: Diagnosis not present

## 2016-08-07 DIAGNOSIS — E291 Testicular hypofunction: Secondary | ICD-10-CM | POA: Diagnosis not present

## 2016-08-07 DIAGNOSIS — E669 Obesity, unspecified: Secondary | ICD-10-CM | POA: Diagnosis not present

## 2016-08-30 DIAGNOSIS — R69 Illness, unspecified: Secondary | ICD-10-CM | POA: Diagnosis not present

## 2016-09-16 DIAGNOSIS — J0100 Acute maxillary sinusitis, unspecified: Secondary | ICD-10-CM | POA: Diagnosis not present

## 2016-12-03 DIAGNOSIS — E291 Testicular hypofunction: Secondary | ICD-10-CM | POA: Diagnosis not present

## 2016-12-03 DIAGNOSIS — E782 Mixed hyperlipidemia: Secondary | ICD-10-CM | POA: Diagnosis not present

## 2016-12-03 DIAGNOSIS — J4520 Mild intermittent asthma, uncomplicated: Secondary | ICD-10-CM | POA: Diagnosis not present

## 2017-02-06 DIAGNOSIS — J209 Acute bronchitis, unspecified: Secondary | ICD-10-CM | POA: Diagnosis not present

## 2017-02-07 DIAGNOSIS — M791 Myalgia: Secondary | ICD-10-CM | POA: Diagnosis not present

## 2017-02-11 DIAGNOSIS — R748 Abnormal levels of other serum enzymes: Secondary | ICD-10-CM | POA: Diagnosis not present

## 2017-02-11 DIAGNOSIS — J029 Acute pharyngitis, unspecified: Secondary | ICD-10-CM | POA: Diagnosis not present

## 2017-03-04 DIAGNOSIS — R1010 Upper abdominal pain, unspecified: Secondary | ICD-10-CM | POA: Diagnosis not present

## 2017-03-12 DIAGNOSIS — R1011 Right upper quadrant pain: Secondary | ICD-10-CM | POA: Diagnosis not present

## 2017-03-12 DIAGNOSIS — R1010 Upper abdominal pain, unspecified: Secondary | ICD-10-CM | POA: Diagnosis not present

## 2017-04-08 DIAGNOSIS — R69 Illness, unspecified: Secondary | ICD-10-CM | POA: Diagnosis not present

## 2017-04-10 DIAGNOSIS — Z23 Encounter for immunization: Secondary | ICD-10-CM | POA: Diagnosis not present

## 2017-05-29 DIAGNOSIS — S76311A Strain of muscle, fascia and tendon of the posterior muscle group at thigh level, right thigh, initial encounter: Secondary | ICD-10-CM | POA: Diagnosis not present

## 2017-06-02 DIAGNOSIS — S76311D Strain of muscle, fascia and tendon of the posterior muscle group at thigh level, right thigh, subsequent encounter: Secondary | ICD-10-CM | POA: Diagnosis not present

## 2017-06-04 DIAGNOSIS — L821 Other seborrheic keratosis: Secondary | ICD-10-CM | POA: Diagnosis not present

## 2017-06-04 DIAGNOSIS — C44329 Squamous cell carcinoma of skin of other parts of face: Secondary | ICD-10-CM | POA: Diagnosis not present

## 2017-06-04 DIAGNOSIS — L578 Other skin changes due to chronic exposure to nonionizing radiation: Secondary | ICD-10-CM | POA: Diagnosis not present

## 2017-06-09 DIAGNOSIS — E782 Mixed hyperlipidemia: Secondary | ICD-10-CM | POA: Diagnosis not present

## 2017-06-09 DIAGNOSIS — E291 Testicular hypofunction: Secondary | ICD-10-CM | POA: Diagnosis not present

## 2017-06-09 DIAGNOSIS — Z0000 Encounter for general adult medical examination without abnormal findings: Secondary | ICD-10-CM | POA: Diagnosis not present

## 2017-06-09 DIAGNOSIS — J4520 Mild intermittent asthma, uncomplicated: Secondary | ICD-10-CM | POA: Diagnosis not present

## 2017-06-09 DIAGNOSIS — Z23 Encounter for immunization: Secondary | ICD-10-CM | POA: Diagnosis not present

## 2017-06-09 DIAGNOSIS — Z125 Encounter for screening for malignant neoplasm of prostate: Secondary | ICD-10-CM | POA: Diagnosis not present

## 2017-06-09 DIAGNOSIS — Z6830 Body mass index (BMI) 30.0-30.9, adult: Secondary | ICD-10-CM | POA: Diagnosis not present

## 2017-06-13 DIAGNOSIS — D0439 Carcinoma in situ of skin of other parts of face: Secondary | ICD-10-CM | POA: Diagnosis not present

## 2017-08-11 DIAGNOSIS — R69 Illness, unspecified: Secondary | ICD-10-CM | POA: Diagnosis not present

## 2017-08-20 DIAGNOSIS — M6281 Muscle weakness (generalized): Secondary | ICD-10-CM | POA: Diagnosis not present

## 2017-08-20 DIAGNOSIS — M25661 Stiffness of right knee, not elsewhere classified: Secondary | ICD-10-CM | POA: Diagnosis not present

## 2017-08-20 DIAGNOSIS — M25561 Pain in right knee: Secondary | ICD-10-CM | POA: Diagnosis not present

## 2017-08-25 DIAGNOSIS — M25561 Pain in right knee: Secondary | ICD-10-CM | POA: Diagnosis not present

## 2017-08-25 DIAGNOSIS — M25661 Stiffness of right knee, not elsewhere classified: Secondary | ICD-10-CM | POA: Diagnosis not present

## 2017-08-25 DIAGNOSIS — M6281 Muscle weakness (generalized): Secondary | ICD-10-CM | POA: Diagnosis not present

## 2017-08-28 DIAGNOSIS — M6281 Muscle weakness (generalized): Secondary | ICD-10-CM | POA: Diagnosis not present

## 2017-08-28 DIAGNOSIS — M25661 Stiffness of right knee, not elsewhere classified: Secondary | ICD-10-CM | POA: Diagnosis not present

## 2017-08-28 DIAGNOSIS — M25561 Pain in right knee: Secondary | ICD-10-CM | POA: Diagnosis not present

## 2017-09-17 DIAGNOSIS — M25661 Stiffness of right knee, not elsewhere classified: Secondary | ICD-10-CM | POA: Diagnosis not present

## 2017-09-17 DIAGNOSIS — M6281 Muscle weakness (generalized): Secondary | ICD-10-CM | POA: Diagnosis not present

## 2017-09-17 DIAGNOSIS — M25561 Pain in right knee: Secondary | ICD-10-CM | POA: Diagnosis not present

## 2017-10-02 DIAGNOSIS — M25561 Pain in right knee: Secondary | ICD-10-CM | POA: Diagnosis not present

## 2017-10-05 DIAGNOSIS — J309 Allergic rhinitis, unspecified: Secondary | ICD-10-CM | POA: Diagnosis not present

## 2017-10-08 DIAGNOSIS — J301 Allergic rhinitis due to pollen: Secondary | ICD-10-CM | POA: Diagnosis not present

## 2017-10-08 DIAGNOSIS — J028 Acute pharyngitis due to other specified organisms: Secondary | ICD-10-CM | POA: Diagnosis not present

## 2017-10-14 DIAGNOSIS — R0602 Shortness of breath: Secondary | ICD-10-CM | POA: Diagnosis not present

## 2017-10-14 DIAGNOSIS — J028 Acute pharyngitis due to other specified organisms: Secondary | ICD-10-CM | POA: Diagnosis not present

## 2017-10-14 DIAGNOSIS — R799 Abnormal finding of blood chemistry, unspecified: Secondary | ICD-10-CM | POA: Diagnosis not present

## 2017-10-14 DIAGNOSIS — I700 Atherosclerosis of aorta: Secondary | ICD-10-CM | POA: Diagnosis not present

## 2017-10-14 DIAGNOSIS — R7989 Other specified abnormal findings of blood chemistry: Secondary | ICD-10-CM | POA: Diagnosis not present

## 2017-10-14 DIAGNOSIS — J90 Pleural effusion, not elsewhere classified: Secondary | ICD-10-CM | POA: Diagnosis not present

## 2017-10-14 DIAGNOSIS — R05 Cough: Secondary | ICD-10-CM | POA: Diagnosis not present

## 2017-10-14 DIAGNOSIS — J209 Acute bronchitis, unspecified: Secondary | ICD-10-CM | POA: Diagnosis not present

## 2017-10-14 DIAGNOSIS — R0600 Dyspnea, unspecified: Secondary | ICD-10-CM | POA: Diagnosis not present

## 2017-10-14 DIAGNOSIS — J9811 Atelectasis: Secondary | ICD-10-CM | POA: Diagnosis not present

## 2017-10-14 DIAGNOSIS — I2510 Atherosclerotic heart disease of native coronary artery without angina pectoris: Secondary | ICD-10-CM | POA: Diagnosis not present

## 2017-10-14 DIAGNOSIS — J060 Acute laryngopharyngitis: Secondary | ICD-10-CM | POA: Diagnosis not present

## 2017-10-23 DIAGNOSIS — M25561 Pain in right knee: Secondary | ICD-10-CM | POA: Diagnosis not present

## 2017-10-30 DIAGNOSIS — M25561 Pain in right knee: Secondary | ICD-10-CM | POA: Diagnosis not present

## 2017-11-19 DIAGNOSIS — E782 Mixed hyperlipidemia: Secondary | ICD-10-CM | POA: Diagnosis not present

## 2017-11-19 DIAGNOSIS — J4520 Mild intermittent asthma, uncomplicated: Secondary | ICD-10-CM | POA: Diagnosis not present

## 2017-11-19 DIAGNOSIS — E291 Testicular hypofunction: Secondary | ICD-10-CM | POA: Diagnosis not present

## 2017-12-01 DIAGNOSIS — L578 Other skin changes due to chronic exposure to nonionizing radiation: Secondary | ICD-10-CM | POA: Diagnosis not present

## 2017-12-01 DIAGNOSIS — C44222 Squamous cell carcinoma of skin of right ear and external auricular canal: Secondary | ICD-10-CM | POA: Diagnosis not present

## 2017-12-01 DIAGNOSIS — L821 Other seborrheic keratosis: Secondary | ICD-10-CM | POA: Diagnosis not present

## 2017-12-01 DIAGNOSIS — L570 Actinic keratosis: Secondary | ICD-10-CM | POA: Diagnosis not present

## 2017-12-01 DIAGNOSIS — D0439 Carcinoma in situ of skin of other parts of face: Secondary | ICD-10-CM | POA: Diagnosis not present

## 2018-03-29 DIAGNOSIS — J069 Acute upper respiratory infection, unspecified: Secondary | ICD-10-CM | POA: Diagnosis not present

## 2018-04-16 DIAGNOSIS — Z23 Encounter for immunization: Secondary | ICD-10-CM | POA: Diagnosis not present

## 2018-05-11 DIAGNOSIS — M25512 Pain in left shoulder: Secondary | ICD-10-CM | POA: Diagnosis not present

## 2018-05-12 ENCOUNTER — Other Ambulatory Visit: Payer: Self-pay | Admitting: Orthopedic Surgery

## 2018-05-12 DIAGNOSIS — G8929 Other chronic pain: Secondary | ICD-10-CM

## 2018-05-12 DIAGNOSIS — M25512 Pain in left shoulder: Principal | ICD-10-CM

## 2018-05-21 ENCOUNTER — Ambulatory Visit
Admission: RE | Admit: 2018-05-21 | Discharge: 2018-05-21 | Disposition: A | Discharge status: Home / Self Care | Payer: PRIVATE HEALTH INSURANCE | Source: Ambulatory Visit | Attending: Orthopedic Surgery | Admitting: Orthopedic Surgery

## 2018-05-21 DIAGNOSIS — G8929 Other chronic pain: Secondary | ICD-10-CM

## 2018-05-21 DIAGNOSIS — M25512 Pain in left shoulder: Principal | ICD-10-CM

## 2018-06-08 DIAGNOSIS — L578 Other skin changes due to chronic exposure to nonionizing radiation: Secondary | ICD-10-CM | POA: Diagnosis not present

## 2018-06-08 DIAGNOSIS — L821 Other seborrheic keratosis: Secondary | ICD-10-CM | POA: Diagnosis not present

## 2018-06-08 DIAGNOSIS — L570 Actinic keratosis: Secondary | ICD-10-CM | POA: Diagnosis not present

## 2018-06-16 DIAGNOSIS — R69 Illness, unspecified: Secondary | ICD-10-CM | POA: Diagnosis not present

## 2018-06-16 DIAGNOSIS — Z0000 Encounter for general adult medical examination without abnormal findings: Secondary | ICD-10-CM | POA: Diagnosis not present

## 2018-06-16 DIAGNOSIS — E291 Testicular hypofunction: Secondary | ICD-10-CM | POA: Diagnosis not present

## 2018-06-16 DIAGNOSIS — E782 Mixed hyperlipidemia: Secondary | ICD-10-CM | POA: Diagnosis not present

## 2018-06-19 DIAGNOSIS — M24112 Other articular cartilage disorders, left shoulder: Secondary | ICD-10-CM | POA: Diagnosis not present

## 2018-06-19 DIAGNOSIS — M19012 Primary osteoarthritis, left shoulder: Secondary | ICD-10-CM | POA: Diagnosis not present

## 2018-06-19 DIAGNOSIS — S46112A Strain of muscle, fascia and tendon of long head of biceps, left arm, initial encounter: Secondary | ICD-10-CM | POA: Diagnosis not present

## 2018-06-19 DIAGNOSIS — Z9889 Other specified postprocedural states: Secondary | ICD-10-CM | POA: Diagnosis not present

## 2018-07-23 DIAGNOSIS — R69 Illness, unspecified: Secondary | ICD-10-CM | POA: Diagnosis not present

## 2018-07-28 DIAGNOSIS — M25512 Pain in left shoulder: Secondary | ICD-10-CM | POA: Diagnosis not present

## 2018-07-28 DIAGNOSIS — M75122 Complete rotator cuff tear or rupture of left shoulder, not specified as traumatic: Secondary | ICD-10-CM | POA: Diagnosis not present

## 2018-09-02 DIAGNOSIS — R69 Illness, unspecified: Secondary | ICD-10-CM | POA: Diagnosis not present

## 2018-12-16 DIAGNOSIS — J4520 Mild intermittent asthma, uncomplicated: Secondary | ICD-10-CM | POA: Diagnosis not present

## 2018-12-16 DIAGNOSIS — E782 Mixed hyperlipidemia: Secondary | ICD-10-CM | POA: Diagnosis not present

## 2018-12-16 DIAGNOSIS — E291 Testicular hypofunction: Secondary | ICD-10-CM | POA: Diagnosis not present

## 2018-12-16 DIAGNOSIS — R69 Illness, unspecified: Secondary | ICD-10-CM | POA: Diagnosis not present

## 2018-12-18 DIAGNOSIS — E782 Mixed hyperlipidemia: Secondary | ICD-10-CM | POA: Diagnosis not present

## 2018-12-18 DIAGNOSIS — E291 Testicular hypofunction: Secondary | ICD-10-CM | POA: Diagnosis not present

## 2019-04-29 DIAGNOSIS — Z1159 Encounter for screening for other viral diseases: Secondary | ICD-10-CM | POA: Diagnosis not present

## 2019-04-29 DIAGNOSIS — J0100 Acute maxillary sinusitis, unspecified: Secondary | ICD-10-CM | POA: Diagnosis not present

## 2019-04-29 DIAGNOSIS — R05 Cough: Secondary | ICD-10-CM | POA: Diagnosis not present

## 2019-05-18 DIAGNOSIS — Z23 Encounter for immunization: Secondary | ICD-10-CM | POA: Diagnosis not present

## 2019-06-21 DIAGNOSIS — Z20828 Contact with and (suspected) exposure to other viral communicable diseases: Secondary | ICD-10-CM | POA: Diagnosis not present

## 2019-06-21 DIAGNOSIS — Z79890 Hormone replacement therapy (postmenopausal): Secondary | ICD-10-CM | POA: Diagnosis not present

## 2019-06-21 DIAGNOSIS — Z809 Family history of malignant neoplasm, unspecified: Secondary | ICD-10-CM | POA: Diagnosis not present

## 2019-06-21 DIAGNOSIS — E785 Hyperlipidemia, unspecified: Secondary | ICD-10-CM | POA: Diagnosis not present

## 2019-06-21 DIAGNOSIS — J309 Allergic rhinitis, unspecified: Secondary | ICD-10-CM | POA: Diagnosis not present

## 2019-06-22 DIAGNOSIS — Z6829 Body mass index (BMI) 29.0-29.9, adult: Secondary | ICD-10-CM | POA: Diagnosis not present

## 2019-06-22 DIAGNOSIS — J438 Other emphysema: Secondary | ICD-10-CM | POA: Diagnosis not present

## 2019-06-22 DIAGNOSIS — I739 Peripheral vascular disease, unspecified: Secondary | ICD-10-CM | POA: Diagnosis not present

## 2019-06-22 DIAGNOSIS — E291 Testicular hypofunction: Secondary | ICD-10-CM | POA: Diagnosis not present

## 2019-06-22 DIAGNOSIS — Z1159 Encounter for screening for other viral diseases: Secondary | ICD-10-CM | POA: Diagnosis not present

## 2019-06-22 DIAGNOSIS — E782 Mixed hyperlipidemia: Secondary | ICD-10-CM | POA: Diagnosis not present

## 2019-07-12 DIAGNOSIS — Z8249 Family history of ischemic heart disease and other diseases of the circulatory system: Secondary | ICD-10-CM | POA: Diagnosis not present

## 2019-07-12 DIAGNOSIS — I739 Peripheral vascular disease, unspecified: Secondary | ICD-10-CM | POA: Diagnosis not present

## 2019-07-14 DIAGNOSIS — U071 COVID-19: Secondary | ICD-10-CM | POA: Diagnosis not present

## 2019-07-14 DIAGNOSIS — R05 Cough: Secondary | ICD-10-CM | POA: Diagnosis not present

## 2019-07-15 DIAGNOSIS — B342 Coronavirus infection, unspecified: Secondary | ICD-10-CM

## 2019-07-15 HISTORY — DX: Coronavirus infection, unspecified: B34.2

## 2019-07-19 ENCOUNTER — Ambulatory Visit: Payer: Self-pay | Admitting: Allergy and Immunology

## 2019-07-24 DIAGNOSIS — R3129 Other microscopic hematuria: Secondary | ICD-10-CM | POA: Diagnosis not present

## 2019-07-24 DIAGNOSIS — R1031 Right lower quadrant pain: Secondary | ICD-10-CM | POA: Diagnosis not present

## 2019-07-24 DIAGNOSIS — R110 Nausea: Secondary | ICD-10-CM | POA: Diagnosis not present

## 2019-07-27 DIAGNOSIS — R109 Unspecified abdominal pain: Secondary | ICD-10-CM | POA: Diagnosis not present

## 2019-07-27 DIAGNOSIS — I10 Essential (primary) hypertension: Secondary | ICD-10-CM | POA: Diagnosis not present

## 2019-07-27 DIAGNOSIS — Z8616 Personal history of COVID-19: Secondary | ICD-10-CM | POA: Diagnosis not present

## 2019-07-28 DIAGNOSIS — Z1159 Encounter for screening for other viral diseases: Secondary | ICD-10-CM | POA: Diagnosis not present

## 2019-07-28 DIAGNOSIS — N200 Calculus of kidney: Secondary | ICD-10-CM | POA: Diagnosis not present

## 2019-07-29 DIAGNOSIS — N201 Calculus of ureter: Secondary | ICD-10-CM | POA: Diagnosis not present

## 2019-07-29 DIAGNOSIS — N3000 Acute cystitis without hematuria: Secondary | ICD-10-CM | POA: Diagnosis not present

## 2019-08-02 DIAGNOSIS — N201 Calculus of ureter: Secondary | ICD-10-CM | POA: Diagnosis not present

## 2019-08-02 DIAGNOSIS — N3020 Other chronic cystitis without hematuria: Secondary | ICD-10-CM | POA: Diagnosis not present

## 2019-08-06 DIAGNOSIS — N201 Calculus of ureter: Secondary | ICD-10-CM | POA: Diagnosis not present

## 2019-08-06 DIAGNOSIS — N200 Calculus of kidney: Secondary | ICD-10-CM | POA: Diagnosis not present

## 2019-08-13 DIAGNOSIS — R091 Pleurisy: Secondary | ICD-10-CM | POA: Diagnosis not present

## 2019-08-13 DIAGNOSIS — B349 Viral infection, unspecified: Secondary | ICD-10-CM | POA: Diagnosis not present

## 2019-08-16 DIAGNOSIS — N201 Calculus of ureter: Secondary | ICD-10-CM | POA: Diagnosis not present

## 2019-08-30 DIAGNOSIS — N201 Calculus of ureter: Secondary | ICD-10-CM | POA: Diagnosis not present

## 2019-08-30 DIAGNOSIS — N3020 Other chronic cystitis without hematuria: Secondary | ICD-10-CM | POA: Diagnosis not present

## 2019-10-21 ENCOUNTER — Encounter: Payer: Self-pay | Admitting: Legal Medicine

## 2019-10-21 ENCOUNTER — Other Ambulatory Visit: Payer: Self-pay

## 2019-10-21 ENCOUNTER — Ambulatory Visit (INDEPENDENT_AMBULATORY_CARE_PROVIDER_SITE_OTHER): Payer: Medicare HMO | Admitting: Legal Medicine

## 2019-10-21 VITALS — BP 124/76 | HR 80 | Temp 98.4°F | Resp 16 | Ht 73.0 in | Wt 229.6 lb

## 2019-10-21 DIAGNOSIS — Z9109 Other allergy status, other than to drugs and biological substances: Secondary | ICD-10-CM

## 2019-10-21 DIAGNOSIS — E782 Mixed hyperlipidemia: Secondary | ICD-10-CM | POA: Diagnosis not present

## 2019-10-21 DIAGNOSIS — F5101 Primary insomnia: Secondary | ICD-10-CM | POA: Insufficient documentation

## 2019-10-21 DIAGNOSIS — R69 Illness, unspecified: Secondary | ICD-10-CM | POA: Diagnosis not present

## 2019-10-21 DIAGNOSIS — J438 Other emphysema: Secondary | ICD-10-CM | POA: Insufficient documentation

## 2019-10-21 DIAGNOSIS — J45909 Unspecified asthma, uncomplicated: Secondary | ICD-10-CM | POA: Diagnosis not present

## 2019-10-21 DIAGNOSIS — E291 Testicular hypofunction: Secondary | ICD-10-CM | POA: Insufficient documentation

## 2019-10-21 DIAGNOSIS — G4700 Insomnia, unspecified: Secondary | ICD-10-CM | POA: Insufficient documentation

## 2019-10-21 MED ORDER — TRIAMCINOLONE ACETONIDE 40 MG/ML IJ SUSP
80.0000 mg | Freq: Once | INTRAMUSCULAR | Status: AC
  Administered 2019-10-21: 80 mg via INTRAMUSCULAR

## 2019-10-21 NOTE — Assessment & Plan Note (Signed)
AN INDIVIDUAL CARE PLAN for testosterone was established and reinforced today.  The patient's status was assessed using clinical findings on exam, labs, and other diagnostic testing. Patient's success at meeting treatment goals based on disease specific evidence-bassed guidelines and found to be in good control. RECOMMENDATIONS include maintaining present medicines and treatment.

## 2019-10-21 NOTE — Assessment & Plan Note (Signed)
AN INDIVIDUAL CARE PLAN for insomnia was established and reinforced today.  The patient's status was assessed using clinical findings on exam, labs, and other diagnostic testing. Patient's success at meeting treatment goals based on disease specific evidence-bassed guidelines and found to be in good control. RECOMMENDATIONS include maintaining present medicines and treatment.

## 2019-10-21 NOTE — Assessment & Plan Note (Signed)
An individualize plan was formulated for care of COPD.  Treatment is evidence based.  She will continue on inhalers, avoid smoking and smoke.  Regular exercise with help with dyspnea. Routine follow ups and medication compliance is needed. 

## 2019-10-21 NOTE — Progress Notes (Signed)
Established Patient Office Visit  Subjective:  Patient ID: [name], male    DOB: 03-16-1951  Age: 69 y.o. MRN: [id number]  CC:  Chief Complaint  Patient presents with  . Hyperlipidemia  . testicuar hypofunction    HPI [name] presents for chronic visit.  Patient presents with hyperlipidemia.  Compliance with treatment has been good; patient takes medicines as directed, maintains low cholesterol diet, follows up as directed, and maintains exercise regimen.  Patient is using atorvastatin without problems.  He is having right shoulder pain.  He had rotator cuff repair in past.  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 To 119m  Past Medical History:  Diagnosis Date  . Asthma    mild, no recent flare ups  . Kidney stones 13 yrs ago  . Mixed hyperlipidemia   . [place]   . PONV (postoperative nausea and vomiting)   . Primary insomnia   . Testicular hypofunction     Past Surgical History:  Procedure Laterality Date  . bilateral rotator cuff repair  right 2004, left 2009  . left ankle fx x 2    . SHOULDER ARTHROSCOPY WITH OPEN ROTATOR CUFF REPAIR AND DISTAL CLAVICLE ACROMINECTOMY  06/10/2012   Procedure: SHOULDER ARTHROSCOPY WITH OPEN ROTATOR CUFF REPAIR AND DISTAL CLAVICLE ACROMINECTOMY;  Surgeon: JMagnus Sinning MD;  Location: [facility];  Service: Orthopedics;  Laterality: Right;  LABRAL DEBRIDEMENT OPEN ANTERIOR ACROMINECTOMY/ROTATOR CUFF REPAIR with anchor    History reviewed. No pertinent family history.  Social History   Socioeconomic History  . Marital status: Married    Spouse name: Not on file  . Number of children: Not on file  . Years of education: Not on file  . Highest education level: Not on file  Occupational History  . Not on file  Tobacco Use  . Smoking status: Former Smoker    Packs/day: 0.25    Years: 3.00    Pack years: 0.75    Types: Cigarettes  . Smokeless tobacco: Never Used  . Tobacco  comment: quit smoking 40 yrs ago  Substance and Sexual Activity  . Alcohol use: Yes    Comment: occasional  . Drug use: No  . Sexual activity: Not on file  Other Topics Concern  . Not on file  Social History Narrative  . Not on file   Social Determinants of Health   Financial Resource Strain:   . Difficulty of Paying Living Expenses:   Food Insecurity:   . Worried About RCharity fundraiserin the Last Year:   . RArboriculturistin the Last Year:   Transportation Needs:   . LFilm/video editor(Medical):   .Marland KitchenLack of Transportation (Non-Medical):   Physical Activity:   . Days of Exercise per Week:   . Minutes of Exercise per Session:   Stress:   . Feeling of Stress :   Social Connections:   . Frequency of Communication with Friends and Family:   . Frequency of Social Gatherings with Friends and Family:   . Attends Religious Services:   . Active Member of Clubs or Organizations:   . Attends CArchivistMeetings:   .Marland KitchenMarital Status:   Intimate Partner Violence:   . Fear of Current or Ex-Partner:   . Emotionally Abused:   .Marland KitchenPhysically Abused:   . Sexually Abused:     Outpatient Medications Prior to Visit  Medication Sig Dispense Refill  . albuterol (PROVENTIL HFA;VENTOLIN HFA)  108 (90 BASE) MCG/ACT inhaler Inhale 1 puff into the lungs every 6 (six) hours as needed. Wheezing and shortness of breath    . atorvastatin (LIPITOR) 40 MG tablet Take 40 mg by mouth daily.    Marland Kitchen testosterone cypionate (DEPOTESTOSTERONE CYPIONATE) 200 MG/ML injection SMARTSIG:0.5 Milliliter(s) IM Every 3 Weeks    . glucosamine-chondroitin 500-400 MG tablet Take 1 tablet by mouth 3 (three) times daily.    Marland Kitchen HYDROcodone-acetaminophen (NORCO) 10-325 MG per tablet Take 1 tablet by mouth every 6 (six) hours as needed for pain. 30 tablet 0  . lovastatin (MEVACOR) 20 MG tablet Take 20 mg by mouth at bedtime.    . methocarbamol (ROBAXIN) 500 MG tablet Take 1 tablet (500 mg total) by mouth every 6  (six) hours as needed. 30 tablet 1  . Multiple Vitamin (MULTIVITAMIN WITH MINERALS) TABS Take 1 tablet by mouth daily.    . Omega-3 Fatty Acids (FISH OIL) 1200 MG CAPS Take 1 capsule by mouth daily.    . vitamin C (ASCORBIC ACID) 500 MG tablet Take 500 mg by mouth daily.    . vitamin E (VITAMIN E) 400 UNIT capsule Take 400 Units by mouth daily.     No facility-administered medications prior to visit.    Allergies  Allergen Reactions  . Penicillins Other (See Comments)    Unknown reaction,    ROS Review of Systems  Constitutional: Negative.   HENT: Positive for sinus pressure, sinus pain and sneezing.   Eyes: Negative.   Respiratory: Negative.   Cardiovascular: Negative.   Gastrointestinal: Negative.   Endocrine: Negative.   Genitourinary: Negative.   Musculoskeletal: Positive for arthralgias.  Neurological: Negative.   Psychiatric/Behavioral: Negative.       Objective:    Physical Exam  Constitutional: He is oriented to person, place, and time. He appears well-developed and well-nourished.  HENT:  Nose: Rhinorrhea present. Right sinus exhibits maxillary sinus tenderness. Left sinus exhibits maxillary sinus tenderness.  Eyes: Pupils are equal, round, and reactive to light. Conjunctivae are normal.  Cardiovascular: Normal rate, regular rhythm and normal heart sounds.  Pulmonary/Chest: Effort normal and breath sounds normal.  Abdominal: Soft. Bowel sounds are normal.  Musculoskeletal:     Cervical back: Normal range of motion and neck supple.     Comments: Right shoulder pain, repaired rotator cuff  Neurological: He is alert and oriented to person, place, and time. He has normal reflexes.  Skin: Skin is warm and dry.  Psychiatric: He has a normal mood and affect.  Vitals reviewed.   BP 124/76   Pulse 80   Temp 98.4 F (36.9 C)   Resp 16   Ht 6' 1"  (1.854 m)   Wt 229 lb 9.6 oz (104.1 kg)   BMI 30.29 kg/m  Wt Readings from Last 3 Encounters:  10/21/19 229 lb 9.6  oz (104.1 kg)  06/10/12 220 lb (99.8 kg)  06/05/12 223 lb (101.2 kg)     Health Maintenance Due  Topic Date Due  . Hepatitis C Screening  Never done  . TETANUS/TDAP  Never done  . COLONOSCOPY  Never done  . PNA vac Low Risk Adult (1 of 2 - PCV13) Never done    There are no preventive care reminders to display for this patient.  No results found for: TSH Lab Results  Component Value Date   WBC 8.8 06/05/2012   HGB 14.8 06/05/2012   HCT 43.0 06/05/2012   MCV 85.7 06/05/2012   PLT 323 06/05/2012  No results found for: NA, K, CHLORIDE, CO2, GLUCOSE, BUN, CREATININE, BILITOT, ALKPHOS, AST, ALT, PROT, ALBUMIN, CALCIUM, ANIONGAP, EGFR, GFR No results found for: CHOL No results found for: HDL No results found for: LDLCALC No results found for: TRIG No results found for: CHOLHDL No results found for: HGBA1C    Assessment & Plan:   Problem List Items Addressed This Visit      Respiratory   Other emphysema ([place])    An individualize plan was formulated for care of COPD.  Treatment is evidence based.  She will continue on inhalers, avoid smoking and smoke.  Regular exercise with help with dyspnea. Routine follow ups and medication compliance is needed.        Endocrine   Testicular hypofunction    AN INDIVIDUAL CARE PLAN for testosterone was established and reinforced today.  The patient's status was assessed using clinical findings on exam, labs, and other diagnostic testing. Patient's success at meeting treatment goals based on disease specific evidence-bassed guidelines and found to be in good control. RECOMMENDATIONS include maintaining present medicines and treatment.        Other   Mixed hyperlipidemia    AN INDIVIDUAL CARE PLAN for hyperlipidemia/ cholesterol was established and reinforced today.  The patient's status was assessed using clinical findings on exam, lab and other diagnostic tests. The patient's disease status was assessed based on evidence-based  guidelines and found to be well controlled. MEDICATIONS were reviewed. SELF MANAGEMENT GOALS have been discussed and patient's success at attaining the goal of low cholesterol was assessed. RECOMMENDATION given include regular exercise 3 days a week and low cholesterol/low fat diet. CLINICAL SUMMARY including written plan to identify barriers unique to the patient due to social or economic  reasons was discussed.      Relevant Medications   atorvastatin (LIPITOR) 40 MG tablet   Primary insomnia    AN INDIVIDUAL CARE PLAN for insomnia was established and reinforced today.  The patient's status was assessed using clinical findings on exam, labs, and other diagnostic testing. Patient's success at meeting treatment goals based on disease specific evidence-bassed guidelines and found to be in good control. RECOMMENDATIONS include maintaining present medicines and treatment.       Other Visit Diagnoses    Asthma due to seasonal allergies    -  Primary   Relevant Medications   triamcinolone acetonide (KENALOG-40) injection 80 mg (Completed)   Environmental allergies       Relevant Medications   triamcinolone acetonide (KENALOG-40) injection 80 mg (Completed)      Meds ordered this encounter  Medications  . triamcinolone acetonide (KENALOG-40) injection 80 mg    Follow-up: Return in about 6 months (around 04/21/2020) for fasting.    Reinaldo Meeker, MD

## 2019-10-21 NOTE — Assessment & Plan Note (Signed)

## 2020-01-13 ENCOUNTER — Other Ambulatory Visit: Payer: Self-pay

## 2020-01-13 ENCOUNTER — Encounter: Payer: Self-pay | Admitting: Legal Medicine

## 2020-01-13 ENCOUNTER — Ambulatory Visit (INDEPENDENT_AMBULATORY_CARE_PROVIDER_SITE_OTHER): Payer: Medicare HMO | Admitting: Legal Medicine

## 2020-01-13 VITALS — BP 140/80 | HR 81 | Temp 97.5°F | Resp 16 | Ht 73.0 in | Wt 228.0 lb

## 2020-01-13 DIAGNOSIS — R69 Illness, unspecified: Secondary | ICD-10-CM | POA: Diagnosis not present

## 2020-01-13 DIAGNOSIS — E291 Testicular hypofunction: Secondary | ICD-10-CM | POA: Diagnosis not present

## 2020-01-13 DIAGNOSIS — E782 Mixed hyperlipidemia: Secondary | ICD-10-CM

## 2020-01-13 DIAGNOSIS — F5101 Primary insomnia: Secondary | ICD-10-CM

## 2020-01-13 DIAGNOSIS — Z6830 Body mass index (BMI) 30.0-30.9, adult: Secondary | ICD-10-CM

## 2020-01-13 MED ORDER — TESTOSTERONE CYPIONATE 200 MG/ML IM SOLN: 200.0000 mg | INTRAMUSCULAR | 0 refills | Status: DC

## 2020-01-13 NOTE — Progress Notes (Signed)
Subjective:  Patient ID: [name], male    DOB: September 01, 1950  Age: 69 y.o. MRN: [id number]  Chief Complaint  Patient presents with  . Hyperlipidemia  . testicular hypofunction    HPI: chronic visit  Patient presents with hyperlipidemia.  Compliance with treatment has been good; patient takes medicines as directed, maintains low cholesterol diet, follows up as directed, and maintains exercise regimen.  Patient is using atorvastatin without problems.  Testicular hypofunction: He is on testostrone. He is stable.   Current Outpatient Medications on File Prior to Visit  Medication Sig Dispense Refill  . albuterol (PROVENTIL HFA;VENTOLIN HFA) 108 (90 BASE) MCG/ACT inhaler Inhale 1 puff into the lungs every 6 (six) hours as needed. Wheezing and shortness of breath    . atorvastatin (LIPITOR) 40 MG tablet Take 40 mg by mouth daily.     No current facility-administered medications on file prior to visit.   Past Medical History:  Diagnosis Date  . Asthma    mild, no recent flare ups  . Kidney stones 13 yrs ago  . Mixed hyperlipidemia   . Other emphysema ([place])   . PONV (postoperative nausea and vomiting)   . Primary insomnia   . Testicular hypofunction    Past Surgical History:  Procedure Laterality Date  . bilateral rotator cuff repair  right 2004, left 2009  . left ankle fx x 2    . SHOULDER ARTHROSCOPY WITH OPEN ROTATOR CUFF REPAIR AND DISTAL CLAVICLE ACROMINECTOMY  06/10/2012   Procedure: SHOULDER ARTHROSCOPY WITH OPEN ROTATOR CUFF REPAIR AND DISTAL CLAVICLE ACROMINECTOMY;  Surgeon: Magnus Sinning, MD;  Location: [facility];  Service: Orthopedics;  Laterality: Right;  LABRAL DEBRIDEMENT OPEN ANTERIOR ACROMINECTOMY/ROTATOR CUFF REPAIR with anchor    History reviewed. No pertinent family history. Social History   Socioeconomic History  . Marital status: Married    Spouse name: Not on file  . Number of children: Not on file  . Years of education: Not on file  . Highest  education level: Not on file  Occupational History  . Not on file  Tobacco Use  . Smoking status: Former Smoker    Packs/day: 0.25    Years: 3.00    Pack years: 0.75    Types: Cigarettes  . Smokeless tobacco: Never Used  . Tobacco comment: quit smoking 40 yrs ago  Vaping Use  . Vaping Use: Never used  Substance and Sexual Activity  . Alcohol use: Yes    Comment: occasional  . Drug use: No  . Sexual activity: Not on file  Other Topics Concern  . Not on file  Social History Narrative  . Not on file   Social Determinants of Health   Financial Resource Strain:   . Difficulty of Paying Living Expenses:   Food Insecurity:   . Worried About Charity fundraiser in the Last Year:   . Arboriculturist in the Last Year:   Transportation Needs:   . Film/video editor (Medical):   Marland Kitchen Lack of Transportation (Non-Medical):   Physical Activity:   . Days of Exercise per Week:   . Minutes of Exercise per Session:   Stress:   . Feeling of Stress :   Social Connections:   . Frequency of Communication with Friends and Family:   . Frequency of Social Gatherings with Friends and Family:   . Attends Religious Services:   . Active Member of Clubs or Organizations:   . Attends Archivist Meetings:   .  Marital Status:     Review of Systems  Constitutional: Negative.   HENT: Negative.   Eyes: Negative.   Respiratory: Negative.   Cardiovascular: Negative.   Gastrointestinal: Negative.   Genitourinary: Negative.   Musculoskeletal: Negative.   Skin: Negative.   Neurological: Negative.   Psychiatric/Behavioral: Negative.      Objective:  BP 140/80   Pulse 81   Temp (!) 97.5 F (36.4 C)   Resp 16   Ht 6\' 1"  (1.854 m)   Wt 228 lb (103.4 kg)   SpO2 100%   BMI 30.08 kg/m   BP/Weight 01/13/2020 10/21/2019 63/07/6008  Systolic BP 932 355 732  Diastolic BP 80 76 68  Wt. (Lbs) 228 229.6 -  BMI 30.08 30.29 -    Physical Exam Vitals reviewed.  Constitutional:       Appearance: Normal appearance.  HENT:     Head: Normocephalic and atraumatic.     Right Ear: Tympanic membrane, ear canal and external ear normal.     Left Ear: Tympanic membrane, ear canal and external ear normal.     Nose: Nose normal.     Mouth/Throat:     Mouth: Mucous membranes are dry.  Eyes:     Extraocular Movements: Extraocular movements intact.     Conjunctiva/sclera: Conjunctivae normal.     Pupils: Pupils are equal, round, and reactive to light.  Cardiovascular:     Rate and Rhythm: Normal rate and regular rhythm.     Pulses: Normal pulses.     Heart sounds: Normal heart sounds.  Pulmonary:     Effort: Pulmonary effort is normal.     Breath sounds: Normal breath sounds.  Abdominal:     General: Abdomen is flat. Bowel sounds are normal.     Palpations: Abdomen is soft.  Musculoskeletal:        General: Normal range of motion.     Cervical back: Normal range of motion and neck supple.  Skin:    General: Skin is warm and dry.     Capillary Refill: Capillary refill takes less than 2 seconds.  Neurological:     General: No focal deficit present.     Mental Status: He is alert and oriented to person, place, and time.  Psychiatric:        Mood and Affect: Mood normal.        Behavior: Behavior normal.        Thought Content: Thought content normal.        Judgment: Judgment normal.       Lab Results  Component Value Date   WBC 8.8 06/05/2012   HGB 14.8 06/05/2012   HCT 43.0 06/05/2012   PLT 323 06/05/2012      Assessment & Plan:   1. Mixed hyperlipidemia - CBC with Differential/Platelet - Comprehensive metabolic panel - Lipid panel AN INDIVIDUAL CARE PLAN for hyperlipidemia/ cholesterol was established and reinforced today.  The patient's status was assessed using clinical findings on exam, lab and other diagnostic tests. The patient's disease status was assessed based on evidence-based guidelines and found to be well controlled. MEDICATIONS were  reviewed. SELF MANAGEMENT GOALS have been discussed and patient's success at attaining the goal of low cholesterol was assessed. RECOMMENDATION given include regular exercise 3 days a week and low cholesterol/low fat diet. CLINICAL SUMMARY including written plan to identify barriers unique to the patient due to social or economic  reasons was discussed.  2. Testicular hypofunction - testosterone cypionate (DEPOTESTOSTERONE CYPIONATE) 200  MG/ML injection; Inject 1 mL (200 mg total) into the muscle every 21 ( twenty-one) days.  Dispense: 10 mL; Refill: 0 - PSA Patient doing well on supplements, no abuse.   3. Primary insomnia Patient is stable on medicines.  BMI 30 An individualize plan was formulated for obesity using patient history and physical exam to encourage weight loss.  An evidence based program was formulated.  Patient is to cut portion size with meals and to plan physical exercise 3 days a week at least 20 minutes.  Weight watchers and other programs are helpful.  Planned amount of weight loss 5 lbs.    Meds ordered this encounter  Medications  . testosterone cypionate (DEPOTESTOSTERONE CYPIONATE) 200 MG/ML injection    Sig: Inject 1 mL (200 mg total) into the muscle every 21 ( twenty-one) days.    Dispense:  10 mL    Refill:  0    Orders Placed This Encounter  Procedures  . CBC with Differential/Platelet  . Comprehensive metabolic panel  . Lipid panel  . PSA     Follow-up: Return in about 6 weeks (around 02/24/2020) for fasting.  An After Visit Summary was printed and given to the patient.  [place] [phone]

## 2020-01-18 NOTE — Progress Notes (Signed)
CBC ok, kidney tests stable, liver normal, Cholesterol normal, PSA 2.2 normaL lp

## 2020-01-19 LAB — COMPREHENSIVE METABOLIC PANEL
ALT: 27 IU/L (ref 0–44)
AST: 28 IU/L (ref 0–40)
Albumin/Globulin Ratio: 1.6 (ref 1.2–2.2)
Albumin: 4.5 g/dL (ref 3.8–4.8)
Alkaline Phosphatase: 114 IU/L (ref 48–121)
BUN/Creatinine Ratio: 13 (ref 10–24)
BUN: 16 mg/dL (ref 8–27)
Bilirubin Total: 1.2 mg/dL (ref 0.0–1.2)
CO2: 22 mmol/L (ref 20–29)
Calcium: 9.3 mg/dL (ref 8.6–10.2)
Chloride: 99 mmol/L (ref 96–106)
Creatinine, Ser: 1.28 mg/dL — ABNORMAL HIGH (ref 0.76–1.27)
GFR calc Af Amer: 66 mL/min/{1.73_m2} (ref >59)
GFR calc non Af Amer: 57 mL/min/{1.73_m2} — ABNORMAL LOW (ref >59)
Globulin, Total: 2.8 g/dL (ref 1.5–4.5)
Glucose: 96 mg/dL (ref 65–99)
Potassium: 5 mmol/L (ref 3.5–5.2)
Sodium: 134 mmol/L (ref 134–144)
Total Protein: 7.3 g/dL (ref 6.0–8.5)

## 2020-01-19 LAB — CBC WITH DIFFERENTIAL/PLATELET
Basophils Absolute: 0.1 10*3/uL (ref 0.0–0.2)
Basos: 1 %
EOS (ABSOLUTE): 0.3 10*3/uL (ref 0.0–0.4)
Eos: 3 %
Hematocrit: 52.7 % — ABNORMAL HIGH (ref 37.5–51.0)
Hemoglobin: 17.4 g/dL (ref 13.0–17.7)
Immature Grans (Abs): 0.1 10*3/uL (ref 0.0–0.1)
Immature Granulocytes: 2 %
Lymphocytes Absolute: 2.6 10*3/uL (ref 0.7–3.1)
Lymphs: 30 %
MCH: 30.3 pg (ref 26.6–33.0)
MCHC: 33 g/dL (ref 31.5–35.7)
MCV: 92 fL (ref 79–97)
Monocytes Absolute: 0.9 10*3/uL (ref 0.1–0.9)
Monocytes: 10 %
Neutrophils Absolute: 4.8 10*3/uL (ref 1.4–7.0)
Neutrophils: 54 %
Platelets: 294 10*3/uL (ref 150–450)
RBC: 5.75 x10E6/uL (ref 4.14–5.80)
RDW: 12.6 % (ref 11.6–15.4)
WBC: 8.8 10*3/uL (ref 3.4–10.8)

## 2020-01-19 LAB — LIPID PANEL
Chol/HDL Ratio: 2.6 ratio (ref 0.0–5.0)
Cholesterol, Total: 139 mg/dL (ref 100–199)
HDL: 53 mg/dL (ref >39)
LDL Chol Calc (NIH): 71 mg/dL (ref 0–99)
Triglycerides: 75 mg/dL (ref 0–149)
VLDL Cholesterol Cal: 15 mg/dL (ref 5–40)

## 2020-01-19 LAB — PSA: Prostate Specific Ag, Serum: 2.2 ng/mL (ref 0.0–4.0)

## 2020-01-19 LAB — CARDIOVASCULAR RISK ASSESSMENT

## 2020-01-25 DIAGNOSIS — E785 Hyperlipidemia, unspecified: Secondary | ICD-10-CM | POA: Diagnosis not present

## 2020-01-25 DIAGNOSIS — I2510 Atherosclerotic heart disease of native coronary artery without angina pectoris: Secondary | ICD-10-CM | POA: Diagnosis not present

## 2020-01-25 DIAGNOSIS — Z008 Encounter for other general examination: Secondary | ICD-10-CM | POA: Diagnosis not present

## 2020-01-25 DIAGNOSIS — E669 Obesity, unspecified: Secondary | ICD-10-CM | POA: Diagnosis not present

## 2020-01-25 DIAGNOSIS — E291 Testicular hypofunction: Secondary | ICD-10-CM | POA: Diagnosis not present

## 2020-01-25 DIAGNOSIS — M1990 Unspecified osteoarthritis, unspecified site: Secondary | ICD-10-CM | POA: Diagnosis not present

## 2020-01-25 DIAGNOSIS — Z809 Family history of malignant neoplasm, unspecified: Secondary | ICD-10-CM | POA: Diagnosis not present

## 2020-01-25 DIAGNOSIS — J45909 Unspecified asthma, uncomplicated: Secondary | ICD-10-CM | POA: Diagnosis not present

## 2020-01-25 DIAGNOSIS — Z6830 Body mass index (BMI) 30.0-30.9, adult: Secondary | ICD-10-CM | POA: Diagnosis not present

## 2020-01-25 DIAGNOSIS — K08409 Partial loss of teeth, unspecified cause, unspecified class: Secondary | ICD-10-CM | POA: Diagnosis not present

## 2020-01-25 DIAGNOSIS — R030 Elevated blood-pressure reading, without diagnosis of hypertension: Secondary | ICD-10-CM | POA: Diagnosis not present

## 2020-02-08 DIAGNOSIS — L820 Inflamed seborrheic keratosis: Secondary | ICD-10-CM | POA: Diagnosis not present

## 2020-02-08 DIAGNOSIS — L578 Other skin changes due to chronic exposure to nonionizing radiation: Secondary | ICD-10-CM | POA: Diagnosis not present

## 2020-02-08 DIAGNOSIS — L821 Other seborrheic keratosis: Secondary | ICD-10-CM | POA: Diagnosis not present

## 2020-03-22 ENCOUNTER — Other Ambulatory Visit: Payer: Self-pay | Admitting: Legal Medicine

## 2020-04-25 DIAGNOSIS — R69 Illness, unspecified: Secondary | ICD-10-CM | POA: Diagnosis not present

## 2020-05-11 DIAGNOSIS — J028 Acute pharyngitis due to other specified organisms: Secondary | ICD-10-CM | POA: Diagnosis not present

## 2020-05-11 DIAGNOSIS — Z20822 Contact with and (suspected) exposure to covid-19: Secondary | ICD-10-CM | POA: Diagnosis not present

## 2020-05-15 ENCOUNTER — Encounter: Payer: Self-pay | Admitting: Legal Medicine

## 2020-05-15 ENCOUNTER — Telehealth (INDEPENDENT_AMBULATORY_CARE_PROVIDER_SITE_OTHER): Payer: Medicare HMO | Admitting: Legal Medicine

## 2020-05-15 VITALS — Resp 16 | Ht 73.0 in | Wt 224.0 lb

## 2020-05-15 DIAGNOSIS — J441 Chronic obstructive pulmonary disease with (acute) exacerbation: Secondary | ICD-10-CM | POA: Diagnosis not present

## 2020-05-15 MED ORDER — ALBUTEROL SULFATE HFA 108 (90 BASE) MCG/ACT IN AERS: 1.0000 | INHALATION_SPRAY | Freq: Four times a day (QID) | RESPIRATORY_TRACT | 6 refills | Status: DC | PRN

## 2020-05-15 MED ORDER — AZITHROMYCIN 250 MG PO TABS: ORAL_TABLET | ORAL | 0 refills | Status: DC

## 2020-05-15 MED ORDER — TRIAMCINOLONE ACETONIDE 40 MG/ML IJ SUSP
80.0000 mg | Freq: Once | INTRAMUSCULAR | Status: AC
  Administered 2020-05-15: 80 mg via INTRAMUSCULAR

## 2020-05-15 NOTE — Progress Notes (Signed)
Virtual Visit via Telephone Note   This visit type was conducted due to national recommendations for restrictions regarding the COVID-19 Pandemic (e.g. social distancing) in an effort to limit this patient's exposure and mitigate transmission in our community.  Due to his co-morbid illnesses, this patient is at least at moderate risk for complications without adequate follow up.  This format is felt to be most appropriate for this patient at this time.  The patient did not have access to video technology/had technical difficulties with video requiring transitioning to audio format only (telephone).  All issues noted in this document were discussed and addressed.  No physical exam could be performed with this format.  Patient verbally consented to a telehealth visit.   Date:  05/15/2020   ID:  [name], DOB 12-Jul-1951, MRN [id number]  Patient Location: Home Provider Location: Office/Clinic  PCP:  Lillard Anes, MD   Evaluation Performed:  New Patient Evaluation  Chief Complaint:  Sick since Tuesday, dry cough  History of Present Illness:    [name] is a 69 y.o. male with Sick since Tuesday, dry cough, negative covid at urgent care, congested. No fever  The patient does not have symptoms concerning for COVID-19 infection (fever, chills, cough, or new shortness of breath).    Past Medical History:  Diagnosis Date  . Kidney stones 13 yrs ago  . Mixed hyperlipidemia   . Other emphysema ([place])   . PONV (postoperative nausea and vomiting)   . Primary insomnia   . Testicular hypofunction     Past Surgical History:  Procedure Laterality Date  . bilateral rotator cuff repair  right 2004, left 2009  . left ankle fx x 2    . SHOULDER ARTHROSCOPY WITH OPEN ROTATOR CUFF REPAIR AND DISTAL CLAVICLE ACROMINECTOMY  06/10/2012   Procedure: SHOULDER ARTHROSCOPY WITH OPEN ROTATOR CUFF REPAIR AND DISTAL CLAVICLE ACROMINECTOMY;  Surgeon: Magnus Sinning, MD;  Location:  [facility];  Service: Orthopedics;  Laterality: Right;  LABRAL DEBRIDEMENT OPEN ANTERIOR ACROMINECTOMY/ROTATOR CUFF REPAIR with anchor    History reviewed. No pertinent family history.  Social History   Socioeconomic History  . Marital status: Married    Spouse name: Not on file  . Number of children: Not on file  . Years of education: Not on file  . Highest education level: Not on file  Occupational History  . Not on file  Tobacco Use  . Smoking status: Former Smoker    Packs/day: 0.25    Years: 3.00    Pack years: 0.75    Types: Cigarettes  . Smokeless tobacco: Never Used  . Tobacco comment: quit smoking 40 yrs ago  Vaping Use  . Vaping Use: Never used  Substance and Sexual Activity  . Alcohol use: Yes    Comment: occasional  . Drug use: No  . Sexual activity: Not on file  Other Topics Concern  . Not on file  Social History Narrative  . Not on file   Social Determinants of Health   Financial Resource Strain:   . Difficulty of Paying Living Expenses: Not on file  Food Insecurity:   . Worried About Charity fundraiser in the Last Year: Not on file  . Ran Out of Food in the Last Year: Not on file  Transportation Needs:   . Lack of Transportation (Medical): Not on file  . Lack of Transportation (Non-Medical): Not on file  Physical Activity:   . Days of Exercise per Week: Not  on file  . Minutes of Exercise per Session: Not on file  Stress:   . Feeling of Stress : Not on file  Social Connections:   . Frequency of Communication with Friends and Family: Not on file  . Frequency of Social Gatherings with Friends and Family: Not on file  . Attends Religious Services: Not on file  . Active Member of Clubs or Organizations: Not on file  . Attends Archivist Meetings: Not on file  . Marital Status: Not on file  Intimate Partner Violence:   . Fear of Current or Ex-Partner: Not on file  . Emotionally Abused: Not on file  . Physically Abused: Not on file  .  Sexually Abused: Not on file    Outpatient Medications Prior to Visit  Medication Sig Dispense Refill  . atorvastatin (LIPITOR) 40 MG tablet Take 40 mg by mouth daily.    . fluticasone (FLONASE) 50 MCG/ACT nasal spray Place into both nostrils daily.    Marland Kitchen testosterone cypionate (DEPOTESTOSTERONE CYPIONATE) 200 MG/ML injection Inject 1 mL (200 mg total) into the muscle every 21 ( twenty-one) days. 10 mL 0  . albuterol (PROVENTIL HFA;VENTOLIN HFA) 108 (90 BASE) MCG/ACT inhaler Inhale 1 puff into the lungs every 6 (six) hours as needed. Wheezing and shortness of breath     No facility-administered medications prior to visit.    Allergies:   Penicillins   Social History   Tobacco Use  . Smoking status: Former Smoker    Packs/day: 0.25    Years: 3.00    Pack years: 0.75    Types: Cigarettes  . Smokeless tobacco: Never Used  . Tobacco comment: quit smoking 40 yrs ago  Vaping Use  . Vaping Use: Never used  Substance Use Topics  . Alcohol use: Yes    Comment: occasional  . Drug use: No     Review of Systems  Constitutional: Negative for chills and fever.  HENT: Positive for congestion and sinus pain.   Eyes: Negative.   Respiratory: Positive for cough. Negative for sputum production and shortness of breath.   Cardiovascular: Negative for orthopnea and claudication.  Gastrointestinal: Negative.   Genitourinary: Negative.   Musculoskeletal: Negative.   Skin: Negative.   Neurological: Negative.   Psychiatric/Behavioral: Negative.      Labs/Other Tests and Data Reviewed:    Recent Labs: 01/13/2020: ALT 27; BUN 16; Creatinine, Ser 1.28; Hemoglobin 17.4; Platelets 294; Potassium 5.0; Sodium 134   Recent Lipid Panel Lab Results  Component Value Date/Time   CHOL 139 01/13/2020 07:52 AM   TRIG 75 01/13/2020 07:52 AM   HDL 53 01/13/2020 07:52 AM   CHOLHDL 2.6 01/13/2020 07:52 AM   LDLCALC 71 01/13/2020 07:52 AM    Wt Readings from Last 3 Encounters:  05/15/20 224 lb (101.6  kg)  01/13/20 228 lb (103.4 kg)  10/21/19 229 lb 9.6 oz (104.1 kg)     Objective:    Vital Signs:  Resp 16   Ht 6\' 1"  (1.854 m)   Wt 224 lb (101.6 kg)   BMI 29.55 kg/m    Physical Exam   ASSESSMENT & PLAN:   1. Obstructive chronic bronchitis with exacerbation ([facility]) - albuterol (VENTOLIN HFA) 108 (90 Base) MCG/ACT inhaler; Inhale 1 puff into the lungs every 6 (six) hours as needed. Wheezing and shortness of breath  Dispense: 18 g; Refill: 6 - azithromycin (ZITHROMAX) 250 MG tablet; 2 tablets on day 1, then 1 tablet daily on days 2-6.  Dispense: 6  tablet; Refill: 0 - triamcinolone acetonide (KENALOG-40) injection 80 mg Patient is having COPD with exacerbation, treat with brochodilator, z-pack, and kenalog      Meds ordered this encounter  Medications  . albuterol (VENTOLIN HFA) 108 (90 Base) MCG/ACT inhaler    Sig: Inhale 1 puff into the lungs every 6 (six) hours as needed. Wheezing and shortness of breath    Dispense:  18 g    Refill:  6  . azithromycin (ZITHROMAX) 250 MG tablet    Sig: 2 tablets on day 1, then 1 tablet daily on days 2-6.    Dispense:  6 tablet    Refill:  0  . triamcinolone acetonide (KENALOG-40) injection 80 mg    COVID-19 Education: The signs and symptoms of COVID-19 were discussed with the patient and how to seek care for testing (follow up with PCP or arrange E-visit). The importance of social distancing was discussed today.  Time:   Today, I have spent 20 minutes with the patient with telehealth technology discussing the above problems.    Follow Up:  In Person prn  Signed, Reinaldo Meeker, MD  05/15/2020 6:38 PM    [place]

## 2020-05-17 DIAGNOSIS — Z0100 Encounter for examination of eyes and vision without abnormal findings: Secondary | ICD-10-CM | POA: Diagnosis not present

## 2020-06-23 ENCOUNTER — Ambulatory Visit (INDEPENDENT_AMBULATORY_CARE_PROVIDER_SITE_OTHER): Payer: Medicare HMO | Admitting: Legal Medicine

## 2020-06-23 ENCOUNTER — Encounter: Payer: Self-pay | Admitting: Legal Medicine

## 2020-06-23 ENCOUNTER — Other Ambulatory Visit: Payer: Self-pay

## 2020-06-23 VITALS — BP 140/78 | HR 75 | Temp 97.3°F | Resp 16 | Ht 73.0 in | Wt 223.2 lb

## 2020-06-23 DIAGNOSIS — J438 Other emphysema: Secondary | ICD-10-CM | POA: Diagnosis not present

## 2020-06-23 DIAGNOSIS — Z0000 Encounter for general adult medical examination without abnormal findings: Secondary | ICD-10-CM | POA: Insufficient documentation

## 2020-06-23 DIAGNOSIS — Z6830 Body mass index (BMI) 30.0-30.9, adult: Secondary | ICD-10-CM

## 2020-06-23 DIAGNOSIS — E782 Mixed hyperlipidemia: Secondary | ICD-10-CM | POA: Diagnosis not present

## 2020-06-23 DIAGNOSIS — E291 Testicular hypofunction: Secondary | ICD-10-CM | POA: Diagnosis not present

## 2020-06-23 MED ORDER — TESTOSTERONE CYPIONATE 200 MG/ML IM SOLN: 200.0000 mg | INTRAMUSCULAR | 0 refills | Status: DC

## 2020-06-23 NOTE — Progress Notes (Signed)
Subjective:  Patient ID: [name], male    DOB: 1951-02-20  Age: 68 y.o. MRN: [id number]  Chief Complaint  Patient presents with  . Annual Exam    AWV    HPI Encounter for general adult medical examination without abnormal findings  Physical ("At Risk" items are starred): Patient's last physical exam was 1 year ago .   Growth percentile SmartLinks can only be used for patients less than 50 years old.   SDOH Screenings   Alcohol Screen: Low Risk   . Last Alcohol Screening Score (AUDIT): 0  Depression (PHQ2-9): Low Risk   . PHQ-2 Score: 0  Financial Resource Strain: Low Risk   . Difficulty of Paying Living Expenses: Not hard at all  Food Insecurity: No Food Insecurity  . Worried About Charity fundraiser in the Last Year: Never true  . Ran Out of Food in the Last Year: Never true  Housing: Low Risk   . Last Housing Risk Score: 0  Physical Activity: Sufficiently Active  . Days of Exercise per Week: 5 days  . Minutes of Exercise per Session: 150+ min  Social Connections: Socially Integrated  . Frequency of Communication with Friends and Family: More than three times a week  . Frequency of Social Gatherings with Friends and Family: Three times a week  . Attends Religious Services: 1 to 4 times per year  . Active Member of Clubs or Organizations: Yes  . Attends Archivist Meetings: More than 4 times per year  . Marital Status: Married  Stress: No Stress Concern Present  . Feeling of Stress : Not at all  Tobacco Use: Medium Risk  . Smoking Tobacco Use: Former Smoker  . Smokeless Tobacco Use: Former Soil scientist Needs: No Transportation Needs  . Lack of Transportation (Medical): No  . Lack of Transportation (Non-Medical): No    Fall Risk  06/23/2020 01/13/2020  Falls in the past year? 0 0  Number falls in past yr: 0 0  Injury with Fall? 0 0  Risk for fall due to : No Fall Risks -  Follow up - Falls evaluation completed    Depression screen [facility]  2/9 06/23/2020 01/13/2020  Decreased Interest 0 0  Down, Depressed, Hopeless 0 0  PHQ - 2 Score 0 0    Functional Status Survey: Is the patient deaf or have difficulty hearing?: No Does the patient have difficulty seeing, even when wearing glasses/contacts?: No Does the patient have difficulty concentrating, remembering, or making decisions?: No Does the patient have difficulty walking or climbing stairs?: No Does the patient have difficulty dressing or bathing?: No Does the patient have difficulty doing errands alone such as visiting a doctor's office or shopping?: No   Safety: reviewed ;  Patient wears a seat belt. Patient's home has smoke detectors and carbon monoxide detectors. Patient practices appropriate gun safety Patient wears sunscreen with extended sun exposure. Dental Care: biannual cleanings, brushes and flosses daily. Ophthalmology/Optometry: Annual visit.  Hearing loss: none Vision impairments: none Patient is not afflicted from Stress Incontinence and Urge Incontinence   Current Outpatient Medications on File Prior to Visit  Medication Sig Dispense Refill  . albuterol (VENTOLIN HFA) 108 (90 Base) MCG/ACT inhaler Inhale 1 puff into the lungs every 6 (six) hours as needed. Wheezing and shortness of breath 18 g 6  . atorvastatin (LIPITOR) 40 MG tablet Take 40 mg by mouth daily.    . fluticasone (FLONASE) 50 MCG/ACT nasal spray Place into  both nostrils daily.    Marland Kitchen testosterone cypionate (DEPOTESTOSTERONE CYPIONATE) 200 MG/ML injection Inject 1 mL (200 mg total) into the muscle every 21 ( twenty-one) days. 10 mL 0   No current facility-administered medications on file prior to visit.    Social Hx   Social History   Socioeconomic History  . Marital status: Married    Spouse name: Not on file  . Number of children: Not on file  . Years of education: Not on file  . Highest education level: Not on file  Occupational History  . Not on file  Tobacco Use  . Smoking  status: Former Smoker    Types: Cigarettes  . Smokeless tobacco: Former Systems developer  . Tobacco comment: quit smoking 40 yrs ago  Vaping Use  . Vaping Use: Never used  Substance and Sexual Activity  . Alcohol use: Yes    Comment: occasionally  . Drug use: Yes    Types: Marijuana  . Sexual activity: Not on file  Other Topics Concern  . Not on file  Social History Narrative  . Not on file   Social Determinants of Health   Financial Resource Strain: Low Risk   . Difficulty of Paying Living Expenses: Not hard at all  Food Insecurity: No Food Insecurity  . Worried About Charity fundraiser in the Last Year: Never true  . Ran Out of Food in the Last Year: Never true  Transportation Needs: No Transportation Needs  . Lack of Transportation (Medical): No  . Lack of Transportation (Non-Medical): No  Physical Activity: Sufficiently Active  . Days of Exercise per Week: 5 days  . Minutes of Exercise per Session: 150+ min  Stress: No Stress Concern Present  . Feeling of Stress : Not at all  Social Connections: Socially Integrated  . Frequency of Communication with Friends and Family: More than three times a week  . Frequency of Social Gatherings with Friends and Family: Three times a week  . Attends Religious Services: 1 to 4 times per year  . Active Member of Clubs or Organizations: Yes  . Attends Archivist Meetings: More than 4 times per year  . Marital Status: Married   Past Medical History:  Diagnosis Date  . Kidney stones 13 yrs ago  . Mixed hyperlipidemia   . Other emphysema ([place])   . PONV (postoperative nausea and vomiting)   . Primary insomnia   . Testicular hypofunction    History reviewed. No pertinent family history.  Review of Systems  Constitutional: Negative for activity change, appetite change and fever.  HENT: Negative for congestion, ear pain and sinus pain.   Eyes: Negative for visual disturbance.  Respiratory: Negative for apnea, cough and shortness of  breath.   Cardiovascular: Negative for chest pain, palpitations and leg swelling.  Gastrointestinal: Negative for abdominal distention and abdominal pain.  Endocrine: Negative for polyuria.  Genitourinary: Negative for difficulty urinating and dysuria.  Musculoskeletal: Positive for arthralgias. Negative for back pain and gait problem.  Skin: Negative.   Neurological: Negative for dizziness, weakness and headaches.  Psychiatric/Behavioral: Negative for agitation, behavioral problems and dysphoric mood.     Objective:  BP 140/78 (BP Location: Left Arm, Patient Position: Sitting, Cuff Size: Normal)   Pulse 75   Temp (!) 97.3 F (36.3 C) (Temporal)   Resp 16   Ht 6\' 1"  (1.854 m)   Wt 223 lb 3.2 oz (101.2 kg)   SpO2 97%   BMI 29.45 kg/m   BP/Weight  06/23/2020 03/16/3299 01/17/2262  Systolic BP 335 - 456  Diastolic BP 78 - 80  Wt. (Lbs) 223.2 224 228  BMI 29.45 29.55 30.08    Physical Exam  NA  Lab Results  Component Value Date   WBC 8.8 01/13/2020   HGB 17.4 01/13/2020   HCT 52.7 (H) 01/13/2020   PLT 294 01/13/2020   GLUCOSE 96 01/13/2020   CHOL 139 01/13/2020   TRIG 75 01/13/2020   HDL 53 01/13/2020   LDLCALC 71 01/13/2020   ALT 27 01/13/2020   AST 28 01/13/2020   NA 134 01/13/2020   K 5.0 01/13/2020   CL 99 01/13/2020   CREATININE 1.28 (H) 01/13/2020   BUN 16 01/13/2020   CO2 22 01/13/2020      Assessment & Plan:  Diagnoses and all orders for this visit: Mixed hyperlipidemia -     CBC with Differential/Platelet -     Comprehensive metabolic panel -     Lipid panel AN INDIVIDUAL CARE PLAN for hyperlipidemia/ cholesterol was established and reinforced today.  The patient's status was assessed using clinical findings on exam, lab and other diagnostic tests. The patient's disease status was assessed based on evidence-based guidelines and found to be well controlled. MEDICATIONS were reviewed. SELF MANAGEMENT GOALS have been discussed and patient's success at  attaining the goal of low cholesterol was assessed. RECOMMENDATION given include regular exercise 3 days a week and low cholesterol/low fat diet. CLINICAL SUMMARY including written plan to identify barriers unique to the patient due to social or economic  reasons was discussed.  Testicular hypofunction -     testosterone cypionate (DEPOTESTOSTERONE CYPIONATE) 200 MG/ML injection; Inject 1 mL (200 mg total) into the muscle every 21 ( twenty-one) days. -     CBC with Differential/Platelet -     Testosterone Free, Profile I -     PSA AN INDIVIDUAL CARE PLAN hypogonadism was established and reinforced today.  The patient's status was assessed using clinical findings on exam, labs, and other diagnostic testing. Patient's success at meeting treatment goals based on disease specific evidence-bassed guidelines and found to be in good control. RECOMMENDATIONS include maintaining  present medicines and treatment. Other emphysema ([place]) An individualize plan was formulated for care of COPD.  Treatment is evidence based.  She will continue on inhalers, avoid smoking and smoke.  Regular exercise with help with dyspnea. Routine follow ups and medication compliance is needed.  BMI 30.0-30.9,adult An individualize plan was formulated for obesity using patient history and physical exam to encourage weight loss.  An evidence based program was formulated.  Patient is to cut portion size with meals and to plan physical exercise 3 days a week at least 20 minutes.  Weight watchers and other programs are helpful.  Planned amount of weight loss 10 lbs.  Routine general medical examination at a health care facility Completed AWV with hearing and vision, we discussed Covid shot Other orders -     Cardiovascular Risk Assessment     These are the goals we discussed: Goals    . DIET - REDUCE FAST FOOD INTAKE        This is a list of the screening recommended for you and due dates:  Health Maintenance  Topic Date  Due  .  Hepatitis C: One time screening is recommended by Center for Disease Control  (CDC) for  adults born from [birth year] through 1965.   Never done  . COVID-19 Vaccine (1) Never done  . Tetanus Vaccine  Never done  . Colon Cancer Screening  Never done  . Pneumonia vaccines (2 of 2 - PPSV23) 06/09/2018  . Flu Shot  02/13/2020      AN INDIVIDUALIZED CARE PLAN: was established or reinforced today.   SELF MANAGEMENT: The patient and I together assessed ways to personally work towards obtaining the recommended goals  Support needs The patient and/or family needs were assessed and services were offered and not necessary at this time.    Follow-up: Return if symptoms worsen or fail to improve.  Reinaldo Meeker, MD [facility] [phone]

## 2020-06-24 LAB — COMPREHENSIVE METABOLIC PANEL
ALT: 38 IU/L (ref 0–44)
AST: 32 IU/L (ref 0–40)
Albumin/Globulin Ratio: 1.8 (ref 1.2–2.2)
Albumin: 4.6 g/dL (ref 3.8–4.8)
Alkaline Phosphatase: 110 IU/L (ref 44–121)
BUN/Creatinine Ratio: 13 (ref 10–24)
BUN: 16 mg/dL (ref 8–27)
Bilirubin Total: 1 mg/dL (ref 0.0–1.2)
CO2: 22 mmol/L (ref 20–29)
Calcium: 9.6 mg/dL (ref 8.6–10.2)
Chloride: 99 mmol/L (ref 96–106)
Creatinine, Ser: 1.27 mg/dL (ref 0.76–1.27)
GFR calc Af Amer: 66 mL/min/{1.73_m2} (ref >59)
GFR calc non Af Amer: 57 mL/min/{1.73_m2} — ABNORMAL LOW (ref >59)
Globulin, Total: 2.6 g/dL (ref 1.5–4.5)
Glucose: 92 mg/dL (ref 65–99)
Potassium: 5.2 mmol/L (ref 3.5–5.2)
Sodium: 137 mmol/L (ref 134–144)
Total Protein: 7.2 g/dL (ref 6.0–8.5)

## 2020-06-24 LAB — LIPID PANEL
Chol/HDL Ratio: 2.2 ratio (ref 0.0–5.0)
Cholesterol, Total: 156 mg/dL (ref 100–199)
HDL: 70 mg/dL (ref >39)
LDL Chol Calc (NIH): 74 mg/dL (ref 0–99)
Triglycerides: 60 mg/dL (ref 0–149)
VLDL Cholesterol Cal: 12 mg/dL (ref 5–40)

## 2020-06-24 LAB — CBC WITH DIFFERENTIAL/PLATELET
Basophils Absolute: 0.1 10*3/uL (ref 0.0–0.2)
Basos: 1 %
EOS (ABSOLUTE): 0.1 10*3/uL (ref 0.0–0.4)
Eos: 2 %
Hematocrit: 52.6 % — ABNORMAL HIGH (ref 37.5–51.0)
Hemoglobin: 17.6 g/dL (ref 13.0–17.7)
Immature Grans (Abs): 0.1 10*3/uL (ref 0.0–0.1)
Immature Granulocytes: 2 %
Lymphocytes Absolute: 2.2 10*3/uL (ref 0.7–3.1)
Lymphs: 28 %
MCH: 31 pg (ref 26.6–33.0)
MCHC: 33.5 g/dL (ref 31.5–35.7)
MCV: 93 fL (ref 79–97)
Monocytes Absolute: 0.8 10*3/uL (ref 0.1–0.9)
Monocytes: 10 %
Neutrophils Absolute: 4.6 10*3/uL (ref 1.4–7.0)
Neutrophils: 57 %
Platelets: 314 10*3/uL (ref 150–450)
RBC: 5.67 x10E6/uL (ref 4.14–5.80)
RDW: 12.4 % (ref 11.6–15.4)
WBC: 7.9 10*3/uL (ref 3.4–10.8)

## 2020-06-24 LAB — TESTOSTERONE FREE, PROFILE I
Sex Hormone Binding: 30.5 nmol/L (ref 19.3–76.4)
Testost., Free, Calc: 27.4 pg/mL — ABNORMAL LOW (ref 34.7–150.3)
Testosterone: 145 ng/dL — ABNORMAL LOW (ref 264–916)

## 2020-06-24 LAB — CARDIOVASCULAR RISK ASSESSMENT

## 2020-06-24 LAB — PSA: Prostate Specific Ag, Serum: 2.2 ng/mL (ref 0.0–4.0)

## 2020-06-25 NOTE — Progress Notes (Signed)
CBC normal, kidney tests stable, Liver tests normal, Cholesterol normal, free testosterone 27.4 good before next dose, PSA 2.2 normal,  lp

## 2020-06-26 ENCOUNTER — Telehealth: Payer: Self-pay

## 2020-06-26 NOTE — Telephone Encounter (Signed)
Left detailed message on VM of lab results.

## 2020-07-17 ENCOUNTER — Ambulatory Visit: Payer: Medicare HMO | Admitting: Legal Medicine

## 2020-07-20 ENCOUNTER — Other Ambulatory Visit: Payer: Self-pay | Admitting: Legal Medicine

## 2020-08-21 DIAGNOSIS — Z0121 Encounter for dental examination and cleaning with abnormal findings: Secondary | ICD-10-CM | POA: Diagnosis not present

## 2020-08-22 DIAGNOSIS — L578 Other skin changes due to chronic exposure to nonionizing radiation: Secondary | ICD-10-CM | POA: Diagnosis not present

## 2020-08-22 DIAGNOSIS — L570 Actinic keratosis: Secondary | ICD-10-CM | POA: Diagnosis not present

## 2020-08-22 DIAGNOSIS — B029 Zoster without complications: Secondary | ICD-10-CM | POA: Diagnosis not present

## 2020-08-31 DIAGNOSIS — Z0121 Encounter for dental examination and cleaning with abnormal findings: Secondary | ICD-10-CM | POA: Diagnosis not present

## 2020-10-10 ENCOUNTER — Other Ambulatory Visit: Payer: Self-pay | Admitting: *Deleted

## 2020-10-10 DIAGNOSIS — E785 Hyperlipidemia, unspecified: Secondary | ICD-10-CM

## 2020-11-07 ENCOUNTER — Other Ambulatory Visit: Payer: Self-pay

## 2020-11-07 ENCOUNTER — Ambulatory Visit (INDEPENDENT_AMBULATORY_CARE_PROVIDER_SITE_OTHER)
Admission: RE | Admit: 2020-11-07 | Discharge: 2020-11-07 | Disposition: A | Discharge status: Home / Self Care | Payer: Self-pay | Source: Ambulatory Visit | Attending: Internal Medicine | Admitting: Internal Medicine

## 2020-11-07 DIAGNOSIS — E785 Hyperlipidemia, unspecified: Secondary | ICD-10-CM

## 2020-11-14 DIAGNOSIS — Z79899 Other long term (current) drug therapy: Secondary | ICD-10-CM | POA: Diagnosis not present

## 2020-11-14 DIAGNOSIS — J45909 Unspecified asthma, uncomplicated: Secondary | ICD-10-CM | POA: Diagnosis not present

## 2020-11-14 DIAGNOSIS — M19019 Primary osteoarthritis, unspecified shoulder: Secondary | ICD-10-CM | POA: Diagnosis not present

## 2020-11-14 DIAGNOSIS — E291 Testicular hypofunction: Secondary | ICD-10-CM | POA: Diagnosis not present

## 2020-11-14 DIAGNOSIS — R030 Elevated blood-pressure reading, without diagnosis of hypertension: Secondary | ICD-10-CM | POA: Diagnosis not present

## 2020-11-14 DIAGNOSIS — Z79890 Hormone replacement therapy (postmenopausal): Secondary | ICD-10-CM | POA: Diagnosis not present

## 2020-11-14 DIAGNOSIS — E663 Overweight: Secondary | ICD-10-CM | POA: Diagnosis not present

## 2020-11-14 DIAGNOSIS — Z6829 Body mass index (BMI) 29.0-29.9, adult: Secondary | ICD-10-CM | POA: Diagnosis not present

## 2020-11-14 DIAGNOSIS — Z7982 Long term (current) use of aspirin: Secondary | ICD-10-CM | POA: Diagnosis not present

## 2020-11-14 DIAGNOSIS — E785 Hyperlipidemia, unspecified: Secondary | ICD-10-CM | POA: Diagnosis not present

## 2020-11-29 ENCOUNTER — Other Ambulatory Visit: Payer: Self-pay

## 2020-11-29 MED ORDER — ATORVASTATIN CALCIUM 40 MG PO TABS: 1.0000 | ORAL_TABLET | Freq: Every day | ORAL | 2 refills | Status: DC

## 2020-12-25 ENCOUNTER — Encounter: Payer: Self-pay | Admitting: Legal Medicine

## 2020-12-25 ENCOUNTER — Other Ambulatory Visit: Payer: Self-pay

## 2020-12-25 ENCOUNTER — Ambulatory Visit (INDEPENDENT_AMBULATORY_CARE_PROVIDER_SITE_OTHER): Payer: Medicare HMO | Admitting: Legal Medicine

## 2020-12-25 VITALS — BP 124/72 | Temp 97.6°F | Resp 16 | Ht 73.0 in | Wt 228.0 lb

## 2020-12-25 DIAGNOSIS — Z23 Encounter for immunization: Secondary | ICD-10-CM

## 2020-12-25 DIAGNOSIS — E782 Mixed hyperlipidemia: Secondary | ICD-10-CM | POA: Diagnosis not present

## 2020-12-25 DIAGNOSIS — E291 Testicular hypofunction: Secondary | ICD-10-CM | POA: Diagnosis not present

## 2020-12-25 DIAGNOSIS — Z6830 Body mass index (BMI) 30.0-30.9, adult: Secondary | ICD-10-CM | POA: Diagnosis not present

## 2020-12-25 DIAGNOSIS — J438 Other emphysema: Secondary | ICD-10-CM | POA: Diagnosis not present

## 2020-12-25 DIAGNOSIS — I10 Essential (primary) hypertension: Secondary | ICD-10-CM | POA: Diagnosis not present

## 2020-12-25 MED ORDER — TESTOSTERONE CYPIONATE 200 MG/ML IM SOLN: 100.0000 mg | INTRAMUSCULAR | 0 refills | Status: DC

## 2020-12-25 NOTE — Progress Notes (Signed)
Established Patient Office Visit  Subjective:  Patient ID: [name], male    DOB: 07-30-1950  Age: 70 y.o. MRN: [id number]  CC:  Chief Complaint  Patient presents with   Hyperlipidemia    HPI [name] presents for hypertension Patient presents for follow up of hypertension.  Patient tolerating diet well with side effects.  Patient was diagnosed with hypertension 010 so has been treated for hypertension for 10 years.Patient is working on maintaining diet and exercise regimen and follows up as directed. Complication include none  Patient presents with hyperlipidemia.  Compliance with treatment has been good; patient takes medicines as directed, maintains low cholesterol diet, follows up as directed, and maintains exercise regimen.  Patient is using lipitor without problems. .   Past Medical History:  Diagnosis Date   Kidney stones 13 yrs ago   Mixed hyperlipidemia    Other emphysema ([facility])    PONV (postoperative nausea and vomiting)    Primary insomnia    Testicular hypofunction     Past Surgical History:  Procedure Laterality Date   bilateral rotator cuff repair  right 2004, left 2009   left ankle fx x 2     SHOULDER ARTHROSCOPY WITH OPEN ROTATOR CUFF REPAIR AND DISTAL CLAVICLE ACROMINECTOMY  06/10/2012   Procedure: SHOULDER ARTHROSCOPY WITH OPEN ROTATOR CUFF REPAIR AND DISTAL CLAVICLE ACROMINECTOMY;  Surgeon: Magnus Sinning, MD;  Location: [facility];  Service: Orthopedics;  Laterality: Right;  LABRAL DEBRIDEMENT OPEN ANTERIOR ACROMINECTOMY/ROTATOR CUFF REPAIR with anchor    No family history on file.  Social History   Socioeconomic History   Marital status: Married    Spouse name: Not on file   Number of children: Not on file   Years of education: Not on file   Highest education level: Not on file  Occupational History   Not on file  Tobacco Use   Smoking status: Former    Pack years: 0.00    Types: Cigarettes   Smokeless tobacco: Former    Tobacco comments:    quit smoking 40 yrs ago  Vaping Use   Vaping Use: Never used  Substance and Sexual Activity   Alcohol use: Yes    Comment: occasionally   Drug use: Yes    Types: Marijuana   Sexual activity: Not on file  Other Topics Concern   Not on file  Social History Narrative   Not on file   Social Determinants of Health   Financial Resource Strain: Low Risk    Difficulty of Paying Living Expenses: Not hard at all  Food Insecurity: No Food Insecurity   Worried About Charity fundraiser in the Last Year: Never true   [place] in the Last Year: Never true  Transportation Needs: No Transportation Needs   Lack of Transportation (Medical): No   Lack of Transportation (Non-Medical): No  Physical Activity: Sufficiently Active   Days of Exercise per Week: 5 days   Minutes of Exercise per Session: 150+ min  Stress: No Stress Concern Present   Feeling of Stress : Not at all  Social Connections: Socially Integrated   Frequency of Communication with Friends and Family: More than three times a week   Frequency of Social Gatherings with Friends and Family: Three times a week   Attends Religious Services: 1 to 4 times per year   Active Member of Clubs or Organizations: Yes   Attends Archivist Meetings: More than 4 times per year  Marital Status: Married  Human resources officer Violence: Not At Risk   Fear of Current or Ex-Partner: No   Emotionally Abused: No   Physically Abused: No   Sexually Abused: No    Outpatient Medications Prior to Visit  Medication Sig Dispense Refill   atorvastatin (LIPITOR) 40 MG tablet Take 1 tablet (40 mg total) by mouth daily. 100 tablet 2   fluticasone (FLONASE) 50 MCG/ACT nasal spray Place into both nostrils daily.     albuterol (VENTOLIN HFA) 108 (90 Base) MCG/ACT inhaler Inhale 1 puff into the lungs every 6 (six) hours as needed. Wheezing and shortness of breath 18 g 6   testosterone cypionate (DEPOTESTOSTERONE CYPIONATE) 200  MG/ML injection Inject 1 mL (200 mg total) into the muscle every 21 ( twenty-one) days. 10 mL 0   No facility-administered medications prior to visit.    Allergies  Allergen Reactions   Penicillins Other (See Comments)    Unknown reaction,    ROS Review of Systems  Constitutional:  Negative for activity change and appetite change.  HENT:  Negative for congestion and facial swelling.   Eyes:  Negative for visual disturbance.  Respiratory:  Negative for cough and chest tightness.   Gastrointestinal:  Negative for abdominal distention and abdominal pain.  Endocrine: Negative for polyuria.  Genitourinary:  Negative for dysuria and frequency.  Musculoskeletal:  Negative for arthralgias and back pain.  Skin: Negative.   Allergic/Immunologic: Negative.   Psychiatric/Behavioral: Negative.       Objective:    Physical Exam Vitals reviewed.  Constitutional:      Appearance: Normal appearance.  HENT:     Head: Normocephalic.     Right Ear: Tympanic membrane, ear canal and external ear normal.     Left Ear: Tympanic membrane, ear canal and external ear normal.     Nose: Nose normal.     Mouth/Throat:     Mouth: Mucous membranes are moist.  Eyes:     Extraocular Movements: Extraocular movements intact.     Conjunctiva/sclera: Conjunctivae normal.     Pupils: Pupils are equal, round, and reactive to light.  Cardiovascular:     Rate and Rhythm: Normal rate and regular rhythm.     Pulses: Normal pulses.  Pulmonary:     Effort: Pulmonary effort is normal. No respiratory distress.     Breath sounds: Normal breath sounds. No rales.  Abdominal:     General: Abdomen is flat. Bowel sounds are normal.     Palpations: Abdomen is soft.  Musculoskeletal:        General: Normal range of motion.     Cervical back: Normal range of motion and neck supple.  Skin:    General: Skin is warm.  Neurological:     General: No focal deficit present.     Mental Status: He is alert. Mental status  is at baseline.  Psychiatric:        Mood and Affect: Mood normal.        Behavior: Behavior normal.        Thought Content: Thought content normal.        Judgment: Judgment normal.    BP 124/72   Temp 97.6 F (36.4 C)   Resp 16   Ht 6\' 1"  (1.854 m)   Wt 228 lb (103.4 kg)   BMI 30.08 kg/m  Wt Readings from Last 3 Encounters:  12/25/20 228 lb (103.4 kg)  06/23/20 223 lb 3.2 oz (101.2 kg)  05/15/20 224 lb (101.6 kg)  Health Maintenance Due  Topic Date Due   COVID-19 Vaccine (1) Never done   Hepatitis C Screening  Never done   TETANUS/TDAP  Never done   COLONOSCOPY (Pts 45-57yrs Insurance coverage will need to be confirmed)  Never done   Zoster Vaccines- Shingrix (1 of 2) Never done    There are no preventive care reminders to display for this patient.  No results found for: TSH Lab Results  Component Value Date   WBC 7.9 06/23/2020   HGB 17.6 06/23/2020   HCT 52.6 (H) 06/23/2020   MCV 93 06/23/2020   PLT 314 06/23/2020   Lab Results  Component Value Date   NA 137 06/23/2020   K 5.2 06/23/2020   CO2 22 06/23/2020   GLUCOSE 92 06/23/2020   BUN 16 06/23/2020   CREATININE 1.27 06/23/2020   BILITOT 1.0 06/23/2020   ALKPHOS 110 06/23/2020   AST 32 06/23/2020   ALT 38 06/23/2020   PROT 7.2 06/23/2020   ALBUMIN 4.6 06/23/2020   CALCIUM 9.6 06/23/2020   Lab Results  Component Value Date   CHOL 156 06/23/2020   Lab Results  Component Value Date   HDL 70 06/23/2020   Lab Results  Component Value Date   LDLCALC 74 06/23/2020   Lab Results  Component Value Date   TRIG 60 06/23/2020   Lab Results  Component Value Date   CHOLHDL 2.2 06/23/2020   No results found for: HGBA1C    Assessment & Plan:   Diagnoses and all orders for this visit: Testicular hypofunction -     Testosterone Free, Profile I -     testosterone cypionate (DEPOTESTOSTERONE CYPIONATE) 200 MG/ML injection; Inject 0.5 mLs (100 mg total) into the muscle every 14 (fourteen)  days. Patient has low testosterone and has decreased his dose and increased his frequency, check levels  Other emphysema ([place]) An individualize plan was formulated for care of COPD.  Treatment is evidence based.  She will continue on inhalers, avoid smoking and smoke.  Regular exercise with help with dyspnea. Routine follow ups and medication compliance is needed.   Mixed hyperlipidemia -     Lipid panel AN INDIVIDUAL CARE PLAN for hyperlipidemia/ cholesterol was established and reinforced today.  The patient's status was assessed using clinical findings on exam, lab and other diagnostic tests. The patient's disease status was assessed based on evidence-based guidelines and found to be well controlled. MEDICATIONS were reviewed. SELF MANAGEMENT GOALS have been discussed and patient's success at attaining the goal of low cholesterol was assessed. RECOMMENDATION given include regular exercise 3 days a week and low cholesterol/low fat diet. CLINICAL SUMMARY including written plan to identify barriers unique to the patient due to social or economic  reasons was discussed.   BMI 30.0-30.9,adult An individualize plan was formulated using patient history and physical exam to encourage weight loss.  An evidence based program was formulated.  Patient is to cut portion size with meals and to plan physical exercise 3 days a week at least 20 minutes.  Weight watchers and other programs are helpful.  Planned amount of weight loss 10 lbs.   Essential hypertension, benign -     CBC with Differential/Platelet -     Comprehensive metabolic panel An individual hypertension care plan was established and reinforced today.  The patient's status was assessed using clinical findings on exam and labs or diagnostic tests. The patient's success at meeting treatment goals on disease specific evidence-based guidelines and found to be well controlled.  SELF MANAGEMENT: The patient and I together assessed ways to personally  work towards obtaining the recommended goals. RECOMMENDATIONS: avoid decongestants found in common cold remedies, decrease consumption of alcohol, perform routine monitoring of BP with home BP cuff, exercise, reduction of dietary salt, take medicines as prescribed, try not to miss doses and quit smoking.  Regular exercise and maintaining a healthy weight is needed.  Stress reduction may help. A CLINICAL SUMMARY including written plan identify barriers to care unique to individual due to social or financial issues.  We attempt to mutually creat solutions for individual and family understanding.  Other orders -     Pneumococcal polysaccharide vaccine 23-valent greater than or equal to 2yo subcutaneous/IM    30 minute visit  Follow-up: Return in about 6 months (around 06/26/2021) for fasting.    Reinaldo Meeker, MD

## 2020-12-26 ENCOUNTER — Other Ambulatory Visit: Payer: Self-pay

## 2020-12-26 DIAGNOSIS — E291 Testicular hypofunction: Secondary | ICD-10-CM

## 2020-12-26 LAB — CBC WITH DIFFERENTIAL/PLATELET
Basophils Absolute: 0.1 10*3/uL (ref 0.0–0.2)
Basos: 1 %
EOS (ABSOLUTE): 0.2 10*3/uL (ref 0.0–0.4)
Eos: 3 %
Hematocrit: 51.6 % — ABNORMAL HIGH (ref 37.5–51.0)
Hemoglobin: 17.5 g/dL (ref 13.0–17.7)
Immature Grans (Abs): 0.1 10*3/uL (ref 0.0–0.1)
Immature Granulocytes: 2 %
Lymphocytes Absolute: 2.4 10*3/uL (ref 0.7–3.1)
Lymphs: 29 %
MCH: 31.2 pg (ref 26.6–33.0)
MCHC: 33.9 g/dL (ref 31.5–35.7)
MCV: 92 fL (ref 79–97)
Monocytes Absolute: 0.8 10*3/uL (ref 0.1–0.9)
Monocytes: 10 %
Neutrophils Absolute: 4.6 10*3/uL (ref 1.4–7.0)
Neutrophils: 55 %
Platelets: 311 10*3/uL (ref 150–450)
RBC: 5.61 x10E6/uL (ref 4.14–5.80)
RDW: 11.9 % (ref 11.6–15.4)
WBC: 8.3 10*3/uL (ref 3.4–10.8)

## 2020-12-26 LAB — TESTOSTERONE FREE, PROFILE I
Sex Hormone Binding: 30.2 nmol/L (ref 19.3–76.4)
Testost., Free, Calc: 190 pg/mL — ABNORMAL HIGH (ref 34.7–150.3)
Testosterone: 801 ng/dL (ref 264–916)

## 2020-12-26 LAB — COMPREHENSIVE METABOLIC PANEL
ALT: 30 IU/L (ref 0–44)
AST: 25 IU/L (ref 0–40)
Albumin/Globulin Ratio: 2 (ref 1.2–2.2)
Albumin: 4.4 g/dL (ref 3.8–4.8)
Alkaline Phosphatase: 121 IU/L (ref 44–121)
BUN/Creatinine Ratio: 13 (ref 10–24)
BUN: 15 mg/dL (ref 8–27)
Bilirubin Total: 0.6 mg/dL (ref 0.0–1.2)
CO2: 22 mmol/L (ref 20–29)
Calcium: 9.3 mg/dL (ref 8.6–10.2)
Chloride: 100 mmol/L (ref 96–106)
Creatinine, Ser: 1.14 mg/dL (ref 0.76–1.27)
Globulin, Total: 2.2 g/dL (ref 1.5–4.5)
Glucose: 93 mg/dL (ref 65–99)
Potassium: 4.8 mmol/L (ref 3.5–5.2)
Sodium: 136 mmol/L (ref 134–144)
Total Protein: 6.6 g/dL (ref 6.0–8.5)
eGFR: 70 mL/min/{1.73_m2} (ref >59)

## 2020-12-26 LAB — LIPID PANEL
Chol/HDL Ratio: 2.2 ratio (ref 0.0–5.0)
Cholesterol, Total: 128 mg/dL (ref 100–199)
HDL: 57 mg/dL (ref >39)
LDL Chol Calc (NIH): 56 mg/dL (ref 0–99)
Triglycerides: 74 mg/dL (ref 0–149)
VLDL Cholesterol Cal: 15 mg/dL (ref 5–40)

## 2020-12-26 LAB — CARDIOVASCULAR RISK ASSESSMENT

## 2020-12-26 NOTE — Progress Notes (Signed)
Hematocrit high 51.6, kidney and liver tests normal, cholesterol normal, free testosterone high- recheck just befor next shot lp

## 2021-01-03 ENCOUNTER — Ambulatory Visit: Payer: Self-pay | Admitting: Allergy and Immunology

## 2021-01-10 ENCOUNTER — Other Ambulatory Visit: Payer: Medicare HMO

## 2021-01-10 ENCOUNTER — Other Ambulatory Visit: Payer: Self-pay

## 2021-01-10 DIAGNOSIS — E291 Testicular hypofunction: Secondary | ICD-10-CM | POA: Diagnosis not present

## 2021-01-12 LAB — TESTOSTERONE,FREE AND TOTAL
Testosterone, Free: 1.5 pg/mL — ABNORMAL LOW (ref 6.6–18.1)
Testosterone: 148 ng/dL — ABNORMAL LOW (ref 264–916)

## 2021-01-12 NOTE — Progress Notes (Signed)
Increase to 200mg  every 14 days lp

## 2021-01-12 NOTE — Progress Notes (Signed)
Testosterone remains low, we can give gel or shots lp

## 2021-01-17 ENCOUNTER — Other Ambulatory Visit: Payer: Self-pay

## 2021-01-17 DIAGNOSIS — E291 Testicular hypofunction: Secondary | ICD-10-CM

## 2021-01-17 MED ORDER — TESTOSTERONE CYPIONATE 200 MG/ML IM SOLN: 200.0000 mg | INTRAMUSCULAR | 0 refills | Status: DC

## 2021-01-17 NOTE — Telephone Encounter (Signed)
Dr Henrene Pastor change the dose of testosterone, can you send a new prescription?

## 2021-02-12 ENCOUNTER — Encounter: Payer: Self-pay | Admitting: Legal Medicine

## 2021-02-12 ENCOUNTER — Telehealth (INDEPENDENT_AMBULATORY_CARE_PROVIDER_SITE_OTHER): Payer: Medicare HMO | Admitting: Legal Medicine

## 2021-02-12 VITALS — Temp 98.6°F | Ht 73.0 in | Wt 225.0 lb

## 2021-02-12 DIAGNOSIS — U071 COVID-19: Secondary | ICD-10-CM

## 2021-02-12 MED ORDER — MOLNUPIRAVIR EUA 200MG CAPSULE: 4.0000 | ORAL_CAPSULE | Freq: Two times a day (BID) | ORAL | 0 refills | Status: AC

## 2021-02-12 NOTE — Progress Notes (Signed)
Virtual Visit via Video Note   This visit type was conducted due to national recommendations for restrictions regarding the COVID-19 Pandemic (e.g. social distancing) in an effort to limit this patient's exposure and mitigate transmission in our community.  Due to his co-morbid illnesses, this patient is at least at moderate risk for complications without adequate follow up.  This format is felt to be most appropriate for this patient at this time.  All issues noted in this document were discussed and addressed.  A limited physical exam was performed with this format.  A verbal consent was obtained for the virtual visit.   Date:  02/12/2021   ID:  [name], DOB 1951/06/29, MRN [id number]  Patient Location: Home Provider Location: Office/Clinic  PCP:  Lillard Anes, MD   Evaluation Performed:  New Patient Evaluation  Chief Complaint:  COUGH  History of Present Illness:    [name] is a 70 y.o. male with sore throat, did covid yesterday, positive home covid,on sunday dry cough  The patient does have symptoms concerning for COVID-19 infection (fever, chills, cough, or new shortness of breath).    Past Medical History:  Diagnosis Date   Kidney stones 13 yrs ago   Mixed hyperlipidemia    Other emphysema (HCC)    PONV (postoperative nausea and vomiting)    Primary insomnia    Testicular hypofunction     Past Surgical History:  Procedure Laterality Date   bilateral rotator cuff repair  right 2004, left 2009   left ankle fx x 2     SHOULDER ARTHROSCOPY WITH OPEN ROTATOR CUFF REPAIR AND DISTAL CLAVICLE ACROMINECTOMY  06/10/2012   Procedure: SHOULDER ARTHROSCOPY WITH OPEN ROTATOR CUFF REPAIR AND DISTAL CLAVICLE ACROMINECTOMY;  Surgeon: Magnus Sinning, MD;  Location: [facility];  Service: Orthopedics;  Laterality: Right;  LABRAL DEBRIDEMENT OPEN ANTERIOR ACROMINECTOMY/ROTATOR CUFF REPAIR with anchor    History reviewed. No pertinent family  history.  Social History   Socioeconomic History   Marital status: Married    Spouse name: Not on file   Number of children: Not on file   Years of education: Not on file   Highest education level: Not on file  Occupational History   Not on file  Tobacco Use   Smoking status: Former    Types: Cigarettes   Smokeless tobacco: Former   Tobacco comments:    quit smoking 40 yrs ago  Vaping Use   Vaping Use: Never used  Substance and Sexual Activity   Alcohol use: Yes    Comment: occasionally   Drug use: Yes    Types: Marijuana   Sexual activity: Not on file  Other Topics Concern   Not on file  Social History Narrative   Not on file   Social Determinants of Health   Financial Resource Strain: Low Risk    Difficulty of Paying Living Expenses: Not hard at all  Food Insecurity: No Food Insecurity   Worried About Charity fundraiser in the Last Year: Never true   [place] in the Last Year: Never true  Transportation Needs: No Transportation Needs   Lack of Transportation (Medical): No   Lack of Transportation (Non-Medical): No  Physical Activity: Sufficiently Active   Days of Exercise per Week: 5 days   Minutes of Exercise per Session: 150+ min  Stress: No Stress Concern Present   Feeling of Stress : Not at all  Social Connections: Socially Integrated  Frequency of Communication with Friends and Family: More than three times a week   Frequency of Social Gatherings with Friends and Family: Three times a week   Attends Religious Services: 1 to 4 times per year   Active Member of Clubs or Organizations: Yes   Attends Music therapist: More than 4 times per year   Marital Status: Married  Human resources officer Violence: Not At Risk   Fear of Current or Ex-Partner: No   Emotionally Abused: No   Physically Abused: No   Sexually Abused: No    Outpatient Medications Prior to Visit  Medication Sig Dispense Refill   atorvastatin (LIPITOR) 40 MG tablet Take 1  tablet (40 mg total) by mouth daily. 100 tablet 2   testosterone cypionate (DEPOTESTOSTERONE CYPIONATE) 200 MG/ML injection Inject 1 mL (200 mg total) into the muscle every 14 (fourteen) days. 10 mL 0   fluticasone (FLONASE) 50 MCG/ACT nasal spray Place into both nostrils daily.     valACYclovir (VALTREX) 1000 MG tablet Take 1,000 mg by mouth 3 (three) times daily.     No facility-administered medications prior to visit.    Allergies:   Penicillins   Social History   Tobacco Use   Smoking status: Former    Types: Cigarettes   Smokeless tobacco: Former   Tobacco comments:    quit smoking 40 yrs ago  Vaping Use   Vaping Use: Never used  Substance Use Topics   Alcohol use: Yes    Comment: occasionally   Drug use: Yes    Types: Marijuana     Review of Systems  Constitutional:  Negative for chills and fever.  HENT:  Negative for congestion.   Eyes:  Negative for redness.  Respiratory:  Positive for cough.   Cardiovascular:  Negative for chest pain and orthopnea.  Genitourinary: Negative.  Negative for dysuria and urgency.  Neurological:  Positive for headaches.    Labs/Other Tests and Data Reviewed:    Recent Labs: 12/25/2020: ALT 30; BUN 15; Creatinine, Ser 1.14; Hemoglobin 17.5; Platelets 311; Potassium 4.8; Sodium 136   Recent Lipid Panel Lab Results  Component Value Date/Time   CHOL 128 12/25/2020 08:09 AM   TRIG 74 12/25/2020 08:09 AM   HDL 57 12/25/2020 08:09 AM   CHOLHDL 2.2 12/25/2020 08:09 AM   LDLCALC 56 12/25/2020 08:09 AM    Wt Readings from Last 3 Encounters:  02/12/21 225 lb (102.1 kg)  12/25/20 228 lb (103.4 kg)  06/23/20 223 lb 3.2 oz (101.2 kg)     Objective:    Vital Signs:  Temp 98.6 F (37 C)   Ht '6\' 1"'$  (1.854 m)   Wt 225 lb (102.1 kg)   BMI 29.69 kg/m    Physical Exam reviewed  ASSESSMENT & PLAN:   Diagnoses and all orders for this visit: COVID -     molnupiravir EUA 200 mg CAPS; Take 4 capsules (800 mg total) by mouth 2 (two)  times daily for 5 days.        COVID-19 Education: The signs and symptoms of COVID-19 were discussed with the patient and how to seek care for testing (follow up with PCP or arrange E-visit). The importance of social distancing was discussed today.   I spent 20 minutes dedicated to the care of this patient on the date of this encounter to include face-to-face time with the patient, as well as:   Follow Up:  In Person prn  Signed, Reinaldo Meeker, MD  02/12/2021 11:56  AM    [place]

## 2021-02-14 ENCOUNTER — Ambulatory Visit: Payer: Self-pay | Admitting: Allergy and Immunology

## 2021-04-18 ENCOUNTER — Other Ambulatory Visit: Payer: Self-pay

## 2021-04-18 ENCOUNTER — Encounter: Payer: Self-pay | Admitting: Allergy and Immunology

## 2021-04-18 ENCOUNTER — Ambulatory Visit: Payer: Medicare HMO | Admitting: Allergy and Immunology

## 2021-04-18 VITALS — BP 110/82 | HR 88 | Resp 16 | Ht 73.0 in | Wt 229.0 lb

## 2021-04-18 DIAGNOSIS — K219 Gastro-esophageal reflux disease without esophagitis: Secondary | ICD-10-CM

## 2021-04-18 DIAGNOSIS — J4520 Mild intermittent asthma, uncomplicated: Secondary | ICD-10-CM | POA: Diagnosis not present

## 2021-04-18 DIAGNOSIS — J301 Allergic rhinitis due to pollen: Secondary | ICD-10-CM | POA: Diagnosis not present

## 2021-04-18 DIAGNOSIS — J3089 Other allergic rhinitis: Secondary | ICD-10-CM

## 2021-04-18 MED ORDER — OMEPRAZOLE 40 MG PO CPDR: 40.0000 mg | DELAYED_RELEASE_CAPSULE | Freq: Every day | ORAL | 5 refills | Status: DC

## 2021-04-18 MED ORDER — FLUTICASONE PROPIONATE 50 MCG/ACT NA SUSP: NASAL | 5 refills | Status: DC

## 2021-04-18 NOTE — Patient Instructions (Addendum)
  1.  Allergen avoidance measures - dust mite, pollen  2.  Treat and prevent inflammation:  A. Flonase - 2 sprays each nostril 3-7 times per week  3.  Treat and prevent reflux/LPR:  A. Minimize caffeine and chocolate consumption B. Omeprazole 40 mg - 1 tablet 1 time per day  4.  If needed:  A. OTC Claritin / Loratadine B.  Albuterol HFA -2 inhalations every 4-6 hours  5.  Obtain fall flu vaccine  6.  Return to clinic in 4 weeks or earlier if problem

## 2021-04-18 NOTE — Progress Notes (Signed)
[place] [facility] [place] [facility] [place] - [place] [facility] [place]   Dear Dr. Henrene Pastor,  Thank you for referring [name] to the [facility] [place] on 04/18/2021.   Below is a summation of this patient's evaluation and recommendations.  Thank you for your referral. I will keep you informed about this patient's response to treatment.   If you have any questions please do not hesitate to contact me.   Sincerely,  Jiles Prows, MD Allergy / Immunology [place] [facility]   ______________________________________________________________________    NEW PATIENT NOTE  Referring Provider: Lillard Anes,* Primary Provider: Lillard Anes, MD Date of office visit: 04/18/2021    Subjective:   Chief Complaint:  [name] (DOB: 04-30-1951) is a 70 y.o. male who presents to the clinic on 04/18/2021 with a chief complaint of Allergic Rhinitis  .     HPI: [name] presents to this clinic in evaluation of 3 issues.  First, he has issues with sneezing especially during the springtime for which he will use Flonase and Claritin which he thinks works relatively well.  He does not have a history of recurrent sinusitis requiring antibiotics or systemic steroids and through the bulk of the year he really does very well as long as its not within the spring season.  Second, he has cough.  He has phlegm in his throat especially in the morning.  This happens most days of the week upon awakening and has been present for about the past 2 years or so.  Throughout the day he has some intermittent throat clearing.  He does have heartburn and reflux and regurgitation that he treats with Tums about 1 time per week.  He drinks 1 coffee per day and has a chocolate smoothie every day.  Third, he has a distant history of childhood asthma which has basically resolved.  He has not used a short acting bronchodilator  in greater than a year and he can exercise extensively with aerobic activity with no problem.  Apparently he did have a flare of his asthma when he contracted suspected COVID in late 2019 but since then has really done well.  He has had 2 additional COVID infections and 3 COVID vaccines.  His most recent COVID infection was August 2022 which was minimal rhinitis.  He did get treated with an antiviral agent during that episode.  Past Medical History:  Diagnosis Date   Asthma    Kidney stones 13 yrs ago   Mixed hyperlipidemia    Other emphysema (HCC)    PONV (postoperative nausea and vomiting)    Primary insomnia    Testicular hypofunction     Past Surgical History:  Procedure Laterality Date   bilateral rotator cuff repair  right 2004, left 2009   left ankle fx x 2     SHOULDER ARTHROSCOPY WITH OPEN ROTATOR CUFF REPAIR AND DISTAL CLAVICLE ACROMINECTOMY  06/10/2012   Procedure: SHOULDER ARTHROSCOPY WITH OPEN ROTATOR CUFF REPAIR AND DISTAL CLAVICLE ACROMINECTOMY;  Surgeon: Magnus Sinning, MD;  Location: [facility];  Service: Orthopedics;  Laterality: Right;  LABRAL DEBRIDEMENT OPEN ANTERIOR ACROMINECTOMY/ROTATOR CUFF REPAIR with anchor    Allergies as of 04/18/2021       Reactions   Penicillins Other (See Comments)   Unknown reaction,        Medication List    atorvastatin 40 MG tablet Commonly known as: LIPITOR Take 1 tablet (  40 mg total) by mouth daily.   testosterone cypionate 200 MG/ML injection Commonly known as: DEPOTESTOSTERONE CYPIONATE Inject 1 mL (200 mg total) into the muscle every 14 (fourteen) days.    Review of systems negative except as noted in HPI / PMHx or noted below:  Review of Systems  Constitutional: Negative.   HENT: Negative.    Eyes: Negative.   Respiratory: Negative.    Cardiovascular: Negative.   Gastrointestinal: Negative.   Genitourinary: Negative.   Musculoskeletal: Negative.   Skin: Negative.   Neurological: Negative.    Endo/Heme/Allergies: Negative.   Psychiatric/Behavioral: Negative.     History reviewed. No pertinent family history.  Social History   Socioeconomic History   Marital status: Married    Spouse name: Not on file   Number of children: Not on file   Years of education: Not on file   Highest education level: Not on file  Occupational History   Not on file  Tobacco Use   Smoking status: Former    Types: Cigarettes   Smokeless tobacco: Former   Tobacco comments:    quit smoking 40 yrs ago  Vaping Use   Vaping Use: Never used  Substance and Sexual Activity   Alcohol use: Yes    Comment: occasionally   Drug use: Yes    Types: Marijuana   Sexual activity: Not on file  Other Topics Concern   Not on file  Social History Narrative   Not on file   Social Determinants of Health   Financial Resource Strain: Low Risk    Difficulty of Paying Living Expenses: Not hard at all  Food Insecurity: No Food Insecurity   Worried About Charity fundraiser in the Last Year: Never true   [place] in the Last Year: Never true  Transportation Needs: No Transportation Needs   Lack of Transportation (Medical): No   Lack of Transportation (Non-Medical): No  Physical Activity: Sufficiently Active   Days of Exercise per Week: 5 days   Minutes of Exercise per Session: 150+ min  Stress: No Stress Concern Present   Feeling of Stress : Not at all  Social Connections: Socially Integrated   Frequency of Communication with Friends and Family: More than three times a week   Frequency of Social Gatherings with Friends and Family: Three times a week   Attends Religious Services: 1 to 4 times per year   Active Member of Clubs or Organizations: Yes   Attends Music therapist: More than 4 times per year   Marital Status: Married  Human resources officer Violence: Not At Risk   Fear of Current or Ex-Partner: No   Emotionally Abused: No   Physically Abused: No   Sexually Abused: No     Environmental and Social history  Lives in a house with a dry environment, no animals located inside the household, carpet in the bedroom, no plastic on the bed, no plastic on the pillow, and no smoking ongoing with inside the household.  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  Objective:   Vitals:   04/18/21 0943  BP: 110/82  Pulse: 88  Resp: 16  SpO2: 96%   Height: 6\' 1"  (185.4 cm) Weight: 229 lb (103.9 kg)  Physical Exam Constitutional:      Appearance: He is not diaphoretic.  HENT:     Head: Normocephalic.     Right Ear: Tympanic membrane, ear  canal and external ear normal.     Left Ear: Tympanic membrane, ear canal and external ear normal.     Nose: Nose normal. No mucosal edema or rhinorrhea.     Mouth/Throat:     Pharynx: Uvula midline. No oropharyngeal exudate.  Eyes:     Conjunctiva/sclera: Conjunctivae normal.  Neck:     Thyroid: No thyromegaly.     Trachea: Trachea normal. No tracheal tenderness or tracheal deviation.  Cardiovascular:     Rate and Rhythm: Normal rate and regular rhythm.     Heart sounds: Normal heart sounds, S1 normal and S2 normal. No murmur heard. Pulmonary:     Effort: No respiratory distress.     Breath sounds: Normal breath sounds. No stridor. No wheezing or rales.  Lymphadenopathy:     Head:     Right side of head: No tonsillar adenopathy.     Left side of head: No tonsillar adenopathy.     Cervical: No cervical adenopathy.  Skin:    Findings: No erythema or rash.     Nails: There is no clubbing.  Neurological:     Mental Status: He is alert.    Diagnostics: Allergy skin tests were performed.  He demonstrated hypersensitivity to house dust mite, grass, weed, tree.  Spirometry was performed and demonstrated an FEV1 of 3.16 @ 90 % of predicted. FEV1/FVC = 0.70  Assessment and Plan:    1. Asthma, mild intermittent, well-controlled   2. Perennial allergic  rhinitis   3. Seasonal allergic rhinitis due to pollen   4. LPRD (laryngopharyngeal reflux disease)     1.  Allergen avoidance measures - dust mite, pollen  2.  Treat and prevent inflammation:  A. Flonase - 2 sprays each nostril 3-7 times per week  3.  Treat and prevent reflux/LPR:  A. Minimize caffeine and chocolate consumption B. Omeprazole 40 mg - 1 tablet 1 time per day  4.  If needed:  A. OTC Claritin / Loratadine B.  Albuterol HFA -2 inhalations every 4-6 hours  5.  Obtain fall flu vaccine  6.  Return to clinic in 4 weeks or earlier if problem  [name] does have an atopic immune system and I suspect he will do very well with allergen avoidance measures and he certainly welcome to use a dose of Flovent with the dosage dependent on disease activity as noted above.  He has a history very consistent with LPR as well and we will treat him with the therapy noted above.  I will see him back in this clinic in 4 weeks to assess his response and consider further evaluation and treatment based upon his response.  Jiles Prows, MD Allergy / Immunology [facility]

## 2021-04-19 ENCOUNTER — Encounter: Payer: Self-pay | Admitting: Allergy and Immunology

## 2021-05-21 ENCOUNTER — Other Ambulatory Visit: Payer: Self-pay

## 2021-05-21 ENCOUNTER — Ambulatory Visit: Payer: Medicare HMO | Admitting: Allergy and Immunology

## 2021-05-21 ENCOUNTER — Encounter: Payer: Self-pay | Admitting: Allergy and Immunology

## 2021-05-21 VITALS — BP 120/68 | HR 78 | Resp 20

## 2021-05-21 DIAGNOSIS — J301 Allergic rhinitis due to pollen: Secondary | ICD-10-CM

## 2021-05-21 DIAGNOSIS — J4520 Mild intermittent asthma, uncomplicated: Secondary | ICD-10-CM | POA: Diagnosis not present

## 2021-05-21 DIAGNOSIS — K219 Gastro-esophageal reflux disease without esophagitis: Secondary | ICD-10-CM | POA: Diagnosis not present

## 2021-05-21 DIAGNOSIS — J3089 Other allergic rhinitis: Secondary | ICD-10-CM

## 2021-05-21 NOTE — Progress Notes (Signed)
[facility] [place]   Follow-up Note  Referring Provider: Lillard Anes Primary Provider: Lillard Anes, MD Date of Office Visit: 05/21/2021  Subjective:   [name] (DOB: 05-Jan-1951) is a 70 y.o. male who returns to the [facility] on 05/21/2021 in re-evaluation of the following:  HPI: [name] returns to this clinic in reevaluation of LPR and allergic rhinitis and intermittent asthma.  His last visit to this clinic was his initial evaluation of 18 April 2021.  He has noticed significant improvement regarding coughing and the issue with his throat.  He has been performing house dust avoidance measures inside the household and he has been consistently using a proton pump inhibitor.  The proton pump inhibitor has given him some "gas" but it does not appear to bother him very much.  He had very little issues with his nose and he only uses a nasal steroid during the springtime season.  Allergies as of 05/21/2021       Reactions   Penicillins Other (See Comments)   Unknown reaction,        Medication List    atorvastatin 40 MG tablet Commonly known as: LIPITOR Take 1 tablet (40 mg total) by mouth daily.   fluticasone 50 MCG/ACT nasal spray Commonly known as: FLONASE 2 sprays in each nostril 3-7 times per week   omeprazole 40 MG capsule Commonly known as: PRILOSEC Take 1 capsule (40 mg total) by mouth daily.   testosterone cypionate 200 MG/ML injection Commonly known as: DEPOTESTOSTERONE CYPIONATE Inject 1 mL (200 mg total) into the muscle every 14 (fourteen) days.    Past Medical History:  Diagnosis Date   Asthma    Kidney stones 13 yrs ago   Mixed hyperlipidemia    Other emphysema (HCC)    PONV (postoperative nausea and vomiting)    Primary insomnia    Testicular hypofunction     Past Surgical History:  Procedure Laterality Date   bilateral rotator cuff repair  right 2004, left  2009   left ankle fx x 2     SHOULDER ARTHROSCOPY WITH OPEN ROTATOR CUFF REPAIR AND DISTAL CLAVICLE ACROMINECTOMY  06/10/2012   Procedure: SHOULDER ARTHROSCOPY WITH OPEN ROTATOR CUFF REPAIR AND DISTAL CLAVICLE ACROMINECTOMY;  Surgeon: Magnus Sinning, MD;  Location: [facility];  Service: Orthopedics;  Laterality: Right;  LABRAL DEBRIDEMENT OPEN ANTERIOR ACROMINECTOMY/ROTATOR CUFF REPAIR with anchor    Review of systems negative except as noted in HPI / PMHx or noted below:  Review of Systems  Constitutional: Negative.   HENT: Negative.    Eyes: Negative.   Respiratory: Negative.    Cardiovascular: Negative.   Gastrointestinal: Negative.   Genitourinary: Negative.   Musculoskeletal: Negative.   Skin: Negative.   Neurological: Negative.   Endo/Heme/Allergies: Negative.   Psychiatric/Behavioral: Negative.      Objective:   Vitals:   05/21/21 0843  BP: 120/68  Pulse: 78  Resp: 20  SpO2: 96%          Physical Exam Constitutional:      Appearance: He is not diaphoretic.  HENT:     Head: Normocephalic.     Right Ear: Tympanic membrane, ear canal and external ear normal.     Left Ear: Tympanic membrane, ear canal and external ear normal.     Nose: Nose normal. No mucosal edema or rhinorrhea.     Mouth/Throat:     Pharynx: Uvula midline. No oropharyngeal exudate.  Eyes:     Conjunctiva/sclera: Conjunctivae normal.  Neck:     Thyroid: No thyromegaly.     Trachea: Trachea normal. No tracheal tenderness or tracheal deviation.  Cardiovascular:     Rate and Rhythm: Normal rate and regular rhythm.     Heart sounds: Normal heart sounds, S1 normal and S2 normal. No murmur heard. Pulmonary:     Effort: No respiratory distress.     Breath sounds: Normal breath sounds. No stridor. No wheezing or rales.  Lymphadenopathy:     Head:     Right side of head: No tonsillar adenopathy.     Left side of head: No tonsillar adenopathy.     Cervical: No cervical adenopathy.  Skin:     Findings: No erythema or rash.     Nails: There is no clubbing.  Neurological:     Mental Status: He is alert.    Diagnostics: none  Assessment and Plan:   1. Asthma, mild intermittent, well-controlled   2. Perennial allergic rhinitis   3. Seasonal allergic rhinitis due to pollen   4. LPRD (laryngopharyngeal reflux disease)     1.  Continue to perform allergen avoidance measures - dust mite, pollen  2.  Continue to treat and prevent inflammation:  A. Flonase - 2 sprays each nostril 3-7 times per week  3.  Continue to treat and prevent reflux/LPR:  A. Minimize caffeine and chocolate consumption B. Omeprazole 40 mg - 1 tablet 1 time per day C.  Replace throat clearing with swallowing / drinking maneuver  4.  If needed:  A. OTC Claritin / Loratadine B.  Albuterol HFA -2 inhalations every 4-6 hours  5.  Return to clinic in 12 weeks or earlier if problem.  Taper medications?  [name] appears to be better and I suspect that this improvement comes because he is performing allergen avoidance measures inside the household and has been consistently addressing his LPR.  We will continue him on this plan as noted above and see him back in his clinic in 12 weeks.  If he continues to show improvement there may be an opportunity to consolidate his medical treatment at that point.  Allena Katz, MD Allergy / Immunology [place]

## 2021-05-21 NOTE — Patient Instructions (Signed)
  1.  Continue to perform allergen avoidance measures - dust mite, pollen  2.  Continue to treat and prevent inflammation:  A. Flonase - 2 sprays each nostril 3-7 times per week  3.  Continue to treat and prevent reflux/LPR:  A. Minimize caffeine and chocolate consumption B. Omeprazole 40 mg - 1 tablet 1 time per day C.  Replace throat clearing with swallowing / drinking maneuver  4.  If needed:  A. OTC Claritin / Loratadine B.  Albuterol HFA -2 inhalations every 4-6 hours  5.  Return to clinic in 12 weeks or earlier if problem.  Taper medications?

## 2021-05-22 ENCOUNTER — Encounter: Payer: Self-pay | Admitting: Allergy and Immunology

## 2021-06-25 ENCOUNTER — Ambulatory Visit (INDEPENDENT_AMBULATORY_CARE_PROVIDER_SITE_OTHER): Payer: Medicare HMO | Admitting: Legal Medicine

## 2021-06-25 ENCOUNTER — Other Ambulatory Visit: Payer: Self-pay

## 2021-06-25 ENCOUNTER — Encounter: Payer: Self-pay | Admitting: Legal Medicine

## 2021-06-25 VITALS — BP 130/88 | HR 84 | Temp 96.5°F | Ht 73.0 in | Wt 227.6 lb

## 2021-06-25 DIAGNOSIS — N529 Male erectile dysfunction, unspecified: Secondary | ICD-10-CM | POA: Diagnosis not present

## 2021-06-25 DIAGNOSIS — E291 Testicular hypofunction: Secondary | ICD-10-CM | POA: Diagnosis not present

## 2021-06-25 DIAGNOSIS — F5101 Primary insomnia: Secondary | ICD-10-CM

## 2021-06-25 DIAGNOSIS — R69 Illness, unspecified: Secondary | ICD-10-CM | POA: Diagnosis not present

## 2021-06-25 DIAGNOSIS — Z6830 Body mass index (BMI) 30.0-30.9, adult: Secondary | ICD-10-CM

## 2021-06-25 DIAGNOSIS — E782 Mixed hyperlipidemia: Secondary | ICD-10-CM | POA: Diagnosis not present

## 2021-06-25 MED ORDER — SILDENAFIL CITRATE 100 MG PO TABS: 100.0000 mg | ORAL_TABLET | Freq: Every day | ORAL | 0 refills | Status: DC | PRN

## 2021-06-25 MED ORDER — ZOLPIDEM TARTRATE 10 MG PO TABS: 10.0000 mg | ORAL_TABLET | Freq: Every evening | ORAL | 1 refills | Status: DC | PRN

## 2021-06-25 MED ORDER — TESTOSTERONE CYPIONATE 200 MG/ML IM SOLN: 200.0000 mg | INTRAMUSCULAR | 2 refills | Status: DC
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
## 2021-06-26 ENCOUNTER — Ambulatory Visit (INDEPENDENT_AMBULATORY_CARE_PROVIDER_SITE_OTHER): Payer: Medicare HMO

## 2021-06-26 ENCOUNTER — Other Ambulatory Visit: Payer: Self-pay

## 2021-06-26 VITALS — BP 122/68 | HR 86 | Resp 16 | Ht 73.0 in | Wt 224.0 lb

## 2021-06-26 DIAGNOSIS — Z0000 Encounter for general adult medical examination without abnormal findings: Secondary | ICD-10-CM | POA: Diagnosis not present

## 2021-06-26 DIAGNOSIS — Z6829 Body mass index (BMI) 29.0-29.9, adult: Secondary | ICD-10-CM

## 2021-06-26 DIAGNOSIS — E875 Hyperkalemia: Secondary | ICD-10-CM

## 2021-06-26 LAB — CBC WITH DIFFERENTIAL/PLATELET
Basophils Absolute: 0.1 10*3/uL (ref 0.0–0.2)
Basos: 1 %
EOS (ABSOLUTE): 0.2 10*3/uL (ref 0.0–0.4)
Eos: 2 %
Hematocrit: 51.1 % — ABNORMAL HIGH (ref 37.5–51.0)
Hemoglobin: 17.1 g/dL (ref 13.0–17.7)
Immature Grans (Abs): 0.2 10*3/uL — ABNORMAL HIGH (ref 0.0–0.1)
Immature Granulocytes: 2 %
Lymphocytes Absolute: 2.7 10*3/uL (ref 0.7–3.1)
Lymphs: 27 %
MCH: 30.2 pg (ref 26.6–33.0)
MCHC: 33.5 g/dL (ref 31.5–35.7)
MCV: 90 fL (ref 79–97)
Monocytes Absolute: 0.9 10*3/uL (ref 0.1–0.9)
Monocytes: 9 %
Neutrophils Absolute: 6.1 10*3/uL (ref 1.4–7.0)
Neutrophils: 59 %
Platelets: 340 10*3/uL (ref 150–450)
RBC: 5.66 x10E6/uL (ref 4.14–5.80)
RDW: 12.4 % (ref 11.6–15.4)
WBC: 10.2 10*3/uL (ref 3.4–10.8)

## 2021-06-26 LAB — COMPREHENSIVE METABOLIC PANEL
ALT: 35 IU/L (ref 0–44)
AST: 27 IU/L (ref 0–40)
Albumin/Globulin Ratio: 1.7 (ref 1.2–2.2)
Albumin: 4.5 g/dL (ref 3.8–4.8)
Alkaline Phosphatase: 128 IU/L — ABNORMAL HIGH (ref 44–121)
BUN/Creatinine Ratio: 10 (ref 10–24)
BUN: 12 mg/dL (ref 8–27)
Bilirubin Total: 0.7 mg/dL (ref 0.0–1.2)
CO2: 23 mmol/L (ref 20–29)
Calcium: 9.4 mg/dL (ref 8.6–10.2)
Chloride: 97 mmol/L (ref 96–106)
Creatinine, Ser: 1.22 mg/dL (ref 0.76–1.27)
Globulin, Total: 2.7 g/dL (ref 1.5–4.5)
Glucose: 94 mg/dL (ref 70–99)
Potassium: 5.3 mmol/L — ABNORMAL HIGH (ref 3.5–5.2)
Sodium: 134 mmol/L (ref 134–144)
Total Protein: 7.2 g/dL (ref 6.0–8.5)
eGFR: 64 mL/min/{1.73_m2} (ref >59)

## 2021-06-26 LAB — TESTOSTERONE FREE, PROFILE I
Sex Hormone Binding: 34.6 nmol/L (ref 19.3–76.4)
Testost., Free, Calc: 72.7 pg/mL (ref 34.7–150.3)
Testosterone: 379 ng/dL (ref 264–916)

## 2021-06-26 LAB — CARDIOVASCULAR RISK ASSESSMENT

## 2021-06-26 LAB — LIPID PANEL
Chol/HDL Ratio: 3.1 ratio (ref 0.0–5.0)
Cholesterol, Total: 129 mg/dL (ref 100–199)
HDL: 42 mg/dL (ref >39)
LDL Chol Calc (NIH): 68 mg/dL (ref 0–99)
Triglycerides: 105 mg/dL (ref 0–149)
VLDL Cholesterol Cal: 19 mg/dL (ref 5–40)

## 2021-06-26 NOTE — Progress Notes (Signed)
Hematocrit high from testosterone, potassium high 5.3, recheck one week, kidney and liver tests normal, cholesterol normal, free testosterone 72.7 still mid normal level- take every 3 weeks lp

## 2021-06-26 NOTE — Patient Instructions (Signed)

## 2021-06-26 NOTE — Progress Notes (Signed)
Subjective:   [name] is a 70 y.o. male who presents for Medicare Annual/Subsequent preventive examination.  This wellness visit is conducted by a nurse.  The patient's medications were reviewed and reconciled since the patient's last visit.  History details were provided by the patient.  The history appears to be reliable.    Patient's last AWV was one year ago.   Medical History: Patient history and Family history was reviewed  Medications, Allergies, and preventative health maintenance was reviewed and updated.   Review of Systems    ROS-Negative Cardiac Risk Factors include: male gender;advanced age (>82men, >45 women)     Objective:    Today's Vitals   06/26/21 1010  BP: 122/68  Pulse: 86  Resp: 16  SpO2: 98%  Weight: 224 lb (101.6 kg)  Height: 6\' 1"  (1.854 m)  PainSc: 0-No pain   Body mass index is 29.55 kg/m.  Advanced Directives 06/26/2021 06/23/2020 06/10/2012 06/10/2012 06/05/2012  Does Patient Have a Medical Advance Directive? No No Patient does not have advance directive;Patient would not like information - Patient does not have advance directive  Would patient like information on creating a medical advance directive? Yes (MAU/Ambulatory/Procedural Areas - Information given) - - - -  Pre-existing out of facility DNR order (yellow form or pink MOST form) - - No No No    Current Medications (verified) Outpatient Encounter Medications as of 06/26/2021  Medication Sig   atorvastatin (LIPITOR) 40 MG tablet Take 1 tablet (40 mg total) by mouth daily.   fluticasone (FLONASE) 50 MCG/ACT nasal spray 2 sprays in each nostril 3-7 times per week   omeprazole (PRILOSEC) 40 MG capsule Take 1 capsule (40 mg total) by mouth daily.   sildenafil (VIAGRA) 100 MG tablet Take 1 tablet (100 mg total) by mouth daily as needed for erectile dysfunction.   testosterone cypionate (DEPOTESTOSTERONE CYPIONATE) 200 MG/ML injection Inject 1 mL (200 mg total) into the muscle  every 21 ( twenty-one) days.   zolpidem (AMBIEN) 10 MG tablet Take 1 tablet (10 mg total) by mouth at bedtime as needed for sleep.   No facility-administered encounter medications on file as of 06/26/2021.    Allergies (verified) Penicillins   History: Past Medical History:  Diagnosis Date   Asthma    Kidney stones 13 yrs ago   Mixed hyperlipidemia    Other emphysema (HCC)    PONV (postoperative nausea and vomiting)    Primary insomnia    Testicular hypofunction    Past Surgical History:  Procedure Laterality Date   bilateral rotator cuff repair  right 2004, left 2009   left ankle fx x 2     SHOULDER ARTHROSCOPY WITH OPEN ROTATOR CUFF REPAIR AND DISTAL CLAVICLE ACROMINECTOMY  06/10/2012   Procedure: SHOULDER ARTHROSCOPY WITH OPEN ROTATOR CUFF REPAIR AND DISTAL CLAVICLE ACROMINECTOMY;  Surgeon: Magnus Sinning, MD;  Location: [facility];  Service: Orthopedics;  Laterality: Right;  LABRAL DEBRIDEMENT OPEN ANTERIOR ACROMINECTOMY/ROTATOR CUFF REPAIR with anchor   Family History  Problem Relation Age of Onset   Lymphoma Brother    Social History   Socioeconomic History   Marital status: Married    Spouse name: Opal Sidles  Tobacco Use   Smoking status: Former    Types: Cigarettes   Smokeless tobacco: Former   Tobacco comments:    quit smoking 40 yrs ago  Vaping Use   Vaping Use: Never used  Substance and Sexual Activity   Alcohol use: Yes    Comment: occasionally  Drug use: Yes    Types: Marijuana   Sexual activity: Not on file   Social Determinants of Health   Financial Resource Strain: Not on file  Food Insecurity: No Food Insecurity   Worried About Running Out of Food in the Last Year: Never true   Ran Out of Food in the Last Year: Never true  Transportation Needs: No Transportation Needs   Lack of Transportation (Medical): No   Lack of Transportation (Non-Medical): No  Physical Activity: Sufficiently Active   Days of Exercise per Week: 5 days   Minutes of Exercise  per Session: 150+ min  Stress: No Stress Concern Present   Feeling of Stress : Not at all  Social Connections: Not on file    Tobacco Counseling Counseling given: Patient no longer uses tobacco products Tobacco comments: quit smoking 40 yrs ago   Clinical Intake:  Pre-visit preparation completed: Yes Pain : No/denies pain Pain Score: 0-No pain   BMI - recorded: 29.55 Nutritional Status: BMI 25 -29 Overweight Nutritional Risks: None Diabetes: No How often do you need to have someone help you when you read instructions, pamphlets, or other written materials from your doctor or pharmacy?: 1 - Never Interpreter Needed?: No   Activities of Daily Living In your present state of health, do you have any difficulty performing the following activities: 06/26/2021  Hearing? N  Vision? N  Difficulty concentrating or making decisions? N  Walking or climbing stairs? N  Dressing or bathing? N  Doing errands, shopping? N  Preparing Food and eating ? N  Using the Toilet? N  In the past six months, have you accidently leaked urine? N  Do you have problems with loss of bowel control? N  Managing your Medications? N  Managing your Finances? N  Housekeeping or managing your Housekeeping? N  Some recent data might be hidden    Patient Care Team: Lillard Anes, MD as PCP - General (Family Medicine) Neldon Mc, Donnamarie Poag, MD as Consulting Physician (Allergy and Immunology)     Assessment:   This is a routine wellness examination for [facility]  Dietary issues and exercise activities discussed: Current Exercise Habits: Structured exercise class;Home exercise routine, Type of exercise: strength training/weights;walking;stretching, Time (Minutes): 60, Frequency (Times/Week): 3, Weekly Exercise (Minutes/Week): 180, Intensity: Moderate, Exercise limited by: None identified   Depression Screen PHQ 2/9 Scores 06/26/2021 12/25/2020 06/23/2020 01/13/2020  PHQ - 2 Score 0 0 0 0    Fall Risk Fall  Risk  06/26/2021 12/25/2020 06/23/2020 01/13/2020  Falls in the past year? 0 0 0 0  Number falls in past yr: 0 0 0 0  Injury with Fall? 0 0 0 0  Risk for fall due to : No Fall Risks - No Fall Risks -  Follow up Falls evaluation completed;Falls prevention discussed - - Falls evaluation completed   Cognitive Function:     6CIT Screen 06/26/2021 06/23/2020  What Year? 0 points 0 points  What month? 0 points 0 points  What time? 0 points 0 points  Count back from 20 0 points 0 points  Months in reverse 0 points 0 points  Repeat phrase 0 points 0 points  Total Score 0 0    Immunizations Immunization History  Administered Date(s) Administered   Fluad Quad(high Dose 65+) 05/01/2021   Influenza-Unspecified 04/16/2018, 05/18/2019   Moderna Sars-Covid-2 Vaccination 08/27/2019, 09/22/2019, 07/11/2020   Pneumococcal Conjugate-13 06/09/2017   Pneumococcal Polysaccharide-23 12/25/2020    TDAP status: Due, Education has been provided regarding  the importance of this vaccine. Advised may receive this vaccine at local pharmacy or Health Dept. Aware to provide a copy of the vaccination record if obtained from local pharmacy or Health Dept. Verbalized acceptance and understanding.  Flu Vaccine status: Up to date  Pneumococcal vaccine status: Up to date  Covid-19 vaccine status: Declined, Education has been provided regarding the importance of this vaccine but patient still declined. Advised may receive this vaccine at local pharmacy or Health Dept.or vaccine clinic. Aware to provide a copy of the vaccination record if obtained from local pharmacy or Health Dept. Verbalized acceptance and understanding.  Qualifies for Shingles Vaccine? Yes   Zostavax completed No   Shingrix Completed?: No.    Education has been provided regarding the importance of this vaccine. Patient has been advised to call insurance company to determine out of pocket expense if they have not yet received this vaccine. Advised  may also receive vaccine at local pharmacy or Health Dept. Verbalized acceptance and understanding.  Screening Tests Health Maintenance  Topic Date Due   Hepatitis C Screening  Never done   TETANUS/TDAP  Never done   COLONOSCOPY (Pts 45-1yrs Insurance coverage will need to be confirmed)  Never done   Zoster Vaccines- Shingrix (1 of 2) Never done   COVID-19 Vaccine (4 - Booster for Moderna series) 09/05/2020   Pneumonia Vaccine 40+ Years old  Completed   INFLUENZA VACCINE  Completed   HPV VACCINES  Aged Out    Health Maintenance  Health Maintenance Due  Topic Date Due   Hepatitis C Screening  Never done   TETANUS/TDAP  Never done   COLONOSCOPY (Pts 45-65yrs Insurance coverage will need to be confirmed)  Never done   Zoster Vaccines- Shingrix (1 of 2) Never done   COVID-19 Vaccine (4 - Booster for Moderna series) 09/05/2020    Colorectal cancer screening: Type of screening: Colonoscopy. Completed 2014. Repeat every 10 years  Lung Cancer Screening: (Low Dose CT Chest recommended if Age 26-80 years, 30 pack-year currently smoking OR have quit w/in 15years.) does not qualify.   Additional Screening:  Vision Screening: Recommended annual ophthalmology exams for early detection of glaucoma and other disorders of the eye. Is the patient up to date with their annual eye exam?  No   Dental Screening: Recommended annual dental exams for proper oral hygiene     Plan:    1- COVID Booster, Tdap, Shringrix Vaccine recommended 2- Educated patient about creating Advance Directive, printed information given to patient to complete and bring copy for his chart once completed.  I have personally reviewed and noted the following in the patients chart:   Medical and social history Use of alcohol, tobacco or illicit drugs  Current medications and supplements including opioid prescriptions. Patient is not currently taking opioid prescriptions. Functional ability and status Nutritional  status Physical activity Advanced directives List of other physicians Hospitalizations, surgeries, and ER visits in previous 12 months Vitals Screenings to include cognitive, depression, and falls Referrals and appointments  In addition, I have reviewed and discussed with patient certain preventive protocols, quality metrics, and best practice recommendations. A written personalized care plan for preventive services as well as general preventive health recommendations were provided to patient.     Erie Noe, LPN   [id number]

## 2021-07-03 ENCOUNTER — Other Ambulatory Visit: Payer: Medicare HMO

## 2021-07-03 ENCOUNTER — Other Ambulatory Visit: Payer: Self-pay

## 2021-07-03 DIAGNOSIS — E291 Testicular hypofunction: Secondary | ICD-10-CM | POA: Diagnosis not present

## 2021-07-03 DIAGNOSIS — E875 Hyperkalemia: Secondary | ICD-10-CM | POA: Diagnosis not present

## 2021-07-03 LAB — COMPREHENSIVE METABOLIC PANEL
ALT: 25 IU/L (ref 0–44)
AST: 24 IU/L (ref 0–40)
Albumin/Globulin Ratio: 1.8 (ref 1.2–2.2)
Albumin: 4.3 g/dL (ref 3.8–4.8)
Alkaline Phosphatase: 122 IU/L — ABNORMAL HIGH (ref 44–121)
BUN/Creatinine Ratio: 15 (ref 10–24)
BUN: 16 mg/dL (ref 8–27)
Bilirubin Total: 0.7 mg/dL (ref 0.0–1.2)
CO2: 23 mmol/L (ref 20–29)
Calcium: 9.1 mg/dL (ref 8.6–10.2)
Chloride: 101 mmol/L (ref 96–106)
Creatinine, Ser: 1.07 mg/dL (ref 0.76–1.27)
Globulin, Total: 2.4 g/dL (ref 1.5–4.5)
Glucose: 98 mg/dL (ref 70–99)
Potassium: 4.9 mmol/L (ref 3.5–5.2)
Sodium: 135 mmol/L (ref 134–144)
Total Protein: 6.7 g/dL (ref 6.0–8.5)
eGFR: 75 mL/min/{1.73_m2} (ref >59)

## 2021-07-04 LAB — PSA: Prostate Specific Ag, Serum: 2.1 ng/mL (ref 0.0–4.0)

## 2021-07-04 NOTE — Progress Notes (Signed)
Kidney and liver tests normal, PSA 2.1,  lp

## 2021-08-13 ENCOUNTER — Encounter: Payer: Self-pay | Admitting: Allergy and Immunology

## 2021-08-13 ENCOUNTER — Other Ambulatory Visit: Payer: Self-pay

## 2021-08-13 ENCOUNTER — Ambulatory Visit: Payer: Medicare HMO | Admitting: Allergy and Immunology

## 2021-08-13 VITALS — BP 118/62 | HR 95 | Resp 16

## 2021-08-13 DIAGNOSIS — J301 Allergic rhinitis due to pollen: Secondary | ICD-10-CM

## 2021-08-13 DIAGNOSIS — K219 Gastro-esophageal reflux disease without esophagitis: Secondary | ICD-10-CM | POA: Diagnosis not present

## 2021-08-13 DIAGNOSIS — J4520 Mild intermittent asthma, uncomplicated: Secondary | ICD-10-CM

## 2021-08-13 DIAGNOSIS — J3089 Other allergic rhinitis: Secondary | ICD-10-CM | POA: Diagnosis not present

## 2021-08-13 NOTE — Progress Notes (Signed)
[place]   Follow-up Note  Referring Provider: Lillard Anes Primary Provider: Lillard Anes, MD Date of Office Visit: 08/13/2021  Subjective:   [name] (DOB: July 08, 1951) is a 71 y.o. male who returns to the [facility] on 08/13/2021 in re-evaluation of the following:  HPI: [name] returns to this clinic in evaluation of LPR, allergic rhinitis, and a history of intermittent asthma.  His last visit to this clinic was 21 May 2021.  Overall he has really done very well on his current plan of addressing reflux induced respiratory disease and his intermittent asthma and allergic rhinitis while paying attention to his caffeine consumption and using omeprazole on a consistent basis and rarely using any nasal steroid.  He has noticed that during very cold weather, when it was in single digits recently, if he went outdoors and worked he did develop a problem with coughing.  But it never kept him from working and he appeared to acclimate to that cold after 15 minutes or so.  About 1 time per week he will wake up in the morning and he will have a little bit of coughing that lasts less than a minute.  Otherwise, he has no significant lower respiratory tract symptoms and does not use a short acting bronchodilator.  His reflux is under very good control at this point in time and the only time he has any issues with reflux is if he eats an extremely spicy meal.  He occasionally smokes some marijuana.  This sounds like a few inhalations per week.  Allergies as of 08/13/2021       Reactions   Penicillins Other (See Comments)   Unknown reaction,        Medication List    atorvastatin 40 MG tablet Commonly known as: LIPITOR Take 1 tablet (40 mg total) by mouth daily.   fluticasone 50 MCG/ACT nasal spray Commonly known as: FLONASE 2 sprays in each nostril 3-7 times per week   omeprazole 40 MG  capsule Commonly known as: PRILOSEC Take 1 capsule (40 mg total) by mouth daily.   sildenafil 100 MG tablet Commonly known as: VIAGRA Take 1 tablet (100 mg total) by mouth daily as needed for erectile dysfunction.   testosterone cypionate 200 MG/ML injection Commonly known as: DEPOTESTOSTERONE CYPIONATE Inject 1 mL (200 mg total) into the muscle every 21 ( twenty-one) days.   zolpidem 10 MG tablet Commonly known as: AMBIEN Take 1 tablet (10 mg total) by mouth at bedtime as needed for sleep.    Past Medical History:  Diagnosis Date   Asthma    Kidney stones 13 yrs ago   Mixed hyperlipidemia    Other emphysema (HCC)    PONV (postoperative nausea and vomiting)    Primary insomnia    Testicular hypofunction     Past Surgical History:  Procedure Laterality Date   bilateral rotator cuff repair  right 2004, left 2009   left ankle fx x 2     SHOULDER ARTHROSCOPY WITH OPEN ROTATOR CUFF REPAIR AND DISTAL CLAVICLE ACROMINECTOMY  06/10/2012   Procedure: SHOULDER ARTHROSCOPY WITH OPEN ROTATOR CUFF REPAIR AND DISTAL CLAVICLE ACROMINECTOMY;  Surgeon: Magnus Sinning, MD;  Location: [facility];  Service: Orthopedics;  Laterality: Right;  LABRAL DEBRIDEMENT OPEN ANTERIOR ACROMINECTOMY/ROTATOR CUFF REPAIR with anchor    Review of systems negative except as noted in HPI / PMHx or noted below:  Review of Systems  Constitutional: Negative.   HENT: Negative.    Eyes: Negative.   Respiratory: Negative.    Cardiovascular: Negative.   Gastrointestinal: Negative.   Genitourinary: Negative.   Musculoskeletal: Negative.   Skin: Negative.   Neurological: Negative.   Endo/Heme/Allergies: Negative.   Psychiatric/Behavioral: Negative.      Objective:   Vitals:   08/13/21 0843  BP: 118/62  Pulse: 95  Resp: 16  SpO2: 98%          Physical Exam Constitutional:      Appearance: He is not diaphoretic.  HENT:     Head: Normocephalic.     Right Ear: Tympanic membrane, ear canal and  external ear normal.     Left Ear: Tympanic membrane, ear canal and external ear normal.     Nose: Nose normal. No mucosal edema or rhinorrhea.     Mouth/Throat:     Pharynx: Uvula midline. No oropharyngeal exudate.  Eyes:     Conjunctiva/sclera: Conjunctivae normal.  Neck:     Thyroid: No thyromegaly.     Trachea: Trachea normal. No tracheal tenderness or tracheal deviation.  Cardiovascular:     Rate and Rhythm: Normal rate and regular rhythm.     Heart sounds: Normal heart sounds, S1 normal and S2 normal. No murmur heard. Pulmonary:     Effort: No respiratory distress.     Breath sounds: Normal breath sounds. No stridor. No wheezing or rales.  Lymphadenopathy:     Head:     Right side of head: No tonsillar adenopathy.     Left side of head: No tonsillar adenopathy.     Cervical: No cervical adenopathy.  Skin:    Findings: No erythema or rash.     Nails: There is no clubbing.  Neurological:     Mental Status: He is alert.    Diagnostics:    Spirometry was performed and demonstrated an FEV1 of 3.12 at 89 % of predicted.   Assessment and Plan:   1. Asthma, mild intermittent, well-controlled   2. Perennial allergic rhinitis   3. Seasonal allergic rhinitis due to pollen   4. LPRD (laryngopharyngeal reflux disease)     1.  Continue to perform allergen avoidance measures - dust mite, pollen, smoke  2.  Continue to treat and prevent inflammation:  A. Flonase - 2 sprays each nostril 3-7 times per week  3.  Continue to treat and prevent reflux/LPR:  A. Minimize caffeine and chocolate consumption B. Omeprazole 40 mg - 1 tablet 1 time per day C. Replace throat clearing with swallowing / drinking maneuver  4.  If needed:  A. OTC Claritin / Loratadine B.  Albuterol HFA -2 inhalations every 4-6 hours  5.  Return to clinic in 6 months or earlier if problem.     Overall [name] is doing relatively well and he has a very good understanding about his disease state and how his  medications work and appropriate dosing of his medications depending on disease activity.  We will keep him on the plan noted above and see him back in this clinic in 6 months or earlier if there is a problem.  Allena Katz, MD Allergy / Immunology [place]

## 2021-08-13 NOTE — Patient Instructions (Addendum)
°  1.  Continue to perform allergen avoidance measures - dust mite, pollen, smoke  2.  Continue to treat and prevent inflammation:  A. Flonase - 2 sprays each nostril 3-7 times per week  3.  Continue to treat and prevent reflux/LPR:  A. Minimize caffeine and chocolate consumption B. Omeprazole 40 mg - 1 tablet 1 time per day C. Replace throat clearing with swallowing / drinking maneuver  4.  If needed:  A. OTC Claritin / Loratadine B.  Albuterol HFA -2 inhalations every 4-6 hours  5.  Return to clinic in 6 months or earlier if problem.

## 2021-08-14 ENCOUNTER — Encounter: Payer: Self-pay | Admitting: Allergy and Immunology

## 2021-09-21 DIAGNOSIS — J302 Other seasonal allergic rhinitis: Secondary | ICD-10-CM | POA: Diagnosis not present

## 2021-10-23 ENCOUNTER — Telehealth: Payer: Self-pay | Admitting: Allergy and Immunology

## 2021-10-23 ENCOUNTER — Other Ambulatory Visit: Payer: Self-pay | Admitting: *Deleted

## 2021-10-23 MED ORDER — OMEPRAZOLE 40 MG PO CPDR: 40.0000 mg | DELAYED_RELEASE_CAPSULE | Freq: Every day | ORAL | 3 refills | Status: DC

## 2021-10-23 NOTE — Telephone Encounter (Signed)
Patient is requesting a refill on omeprazole sent to [facility].  ?

## 2021-10-23 NOTE — Telephone Encounter (Signed)
RX sent and Clair Gulling informed.  ?

## 2021-12-17 ENCOUNTER — Other Ambulatory Visit: Payer: Self-pay | Admitting: Legal Medicine

## 2021-12-25 ENCOUNTER — Encounter: Payer: Self-pay | Admitting: Legal Medicine

## 2021-12-25 NOTE — Progress Notes (Unsigned)
Subjective:  Patient ID: [name], male    DOB: [birth year]  Age: 71 y.o. MRN: [id number]  Chief Complaint  Patient presents with   Hyperlipidemia   Emphysema    HPI   Patient presents with hyperlipidemia.  Compliance with treatment has been good; patient takes medicines as directed, maintains low cholesterol diet, follows up as directed, and maintains exercise regimen.  Patient is using Atorvastatin 40 mg daily without problems.   GERD: He takes Omeprazole 40 mg daily.  Testicular Hypofunction: Patient is using Testosterone 200 mg injection IM every 21 days, sildenafil 100 mg daily PRN. Current Outpatient Medications on File Prior to Visit  Medication Sig Dispense Refill   atorvastatin (LIPITOR) 40 MG tablet TAKE ONE TABLET BY MOUTH EVERY DAY 100 tablet 0   fluticasone (FLONASE) 50 MCG/ACT nasal spray 2 sprays in each nostril 3-7 times per week 16 g 5   omeprazole (PRILOSEC) 40 MG capsule Take 1 capsule (40 mg total) by mouth daily. 30 capsule 3   sildenafil (VIAGRA) 100 MG tablet Take 1 tablet (100 mg total) by mouth daily as needed for erectile dysfunction. 10 tablet 0   testosterone cypionate (DEPOTESTOSTERONE CYPIONATE) 200 MG/ML injection Inject 1 mL (200 mg total) into the muscle every 21 ( twenty-one) days. 10 mL 2   zolpidem (AMBIEN) 10 MG tablet Take 1 tablet (10 mg total) by mouth at bedtime as needed for sleep. 15 tablet 1   No current facility-administered medications on file prior to visit.   Past Medical History:  Diagnosis Date   Asthma    Coronavirus infection 07/15/2019   Formatting of this note might be different from the original. Notification of POS result Isolation since day of testing of patient and family members [place] Dept will call with specific instructions Conv Care their POC until the LHD contacts them Obtaining testing for symptomatic Family Members Discuss self-isolation measures Discuss in-home separation and safety measures Discuss  web-based C   Kidney stones 13 yrs ago   Mixed hyperlipidemia    Other emphysema ([place])    PONV (postoperative nausea and vomiting)    Primary insomnia    Testicular hypofunction    Past Surgical History:  Procedure Laterality Date   bilateral rotator cuff repair  right 2004, left 2009   left ankle fx x 2     SHOULDER ARTHROSCOPY WITH OPEN ROTATOR CUFF REPAIR AND DISTAL CLAVICLE ACROMINECTOMY  06/10/2012   Procedure: SHOULDER ARTHROSCOPY WITH OPEN ROTATOR CUFF REPAIR AND DISTAL CLAVICLE ACROMINECTOMY;  Surgeon: Magnus Sinning, MD;  Location: [facility];  Service: Orthopedics;  Laterality: Right;  LABRAL DEBRIDEMENT OPEN ANTERIOR ACROMINECTOMY/ROTATOR CUFF REPAIR with anchor    Family History  Problem Relation Age of Onset   Lymphoma Brother    Social History   Socioeconomic History   Marital status: Married    Spouse name: Opal Sidles   Number of children: Not on file   Years of education: Not on file   Highest education level: Not on file  Occupational History   Not on file  Tobacco Use   Smoking status: Former    Types: Cigarettes   Smokeless tobacco: Former   Tobacco comments:    quit smoking 40 yrs ago  Electronics engineer Use   Vaping Use: Never used  Substance and Sexual Activity   Alcohol use: Yes    Comment: occasionally   Drug use: Yes    Types: Marijuana   Sexual activity: Not on file  Other Topics  Concern   Not on file  Social History Narrative   Not on file   Social Determinants of Health   Financial Resource Strain: Low Risk  (06/23/2020)   Overall Financial Resource Strain (CARDIA)    Difficulty of Paying Living Expenses: Not hard at all  Food Insecurity: No Food Insecurity (06/26/2021)   Hunger Vital Sign    Worried About Running Out of Food in the Last Year: Never true    Ran Out of Food in the Last Year: Never true  Transportation Needs: No Transportation Needs (06/26/2021)   PRAPARE - Hydrologist (Medical): No    Lack of  Transportation (Non-Medical): No  Physical Activity: Sufficiently Active (06/26/2021)   Exercise Vital Sign    Days of Exercise per Week: 5 days    Minutes of Exercise per Session: 150+ min  Stress: No Stress Concern Present (06/26/2021)   [place]    Feeling of Stress : Not at all  Social Connections: [place] (06/23/2020)   Social Connection and Isolation Panel [NHANES]    Frequency of Communication with Friends and Family: More than three times a week    Frequency of Social Gatherings with Friends and Family: Three times a week    Attends Religious Services: 1 to 4 times per year    Active Member of Clubs or Organizations: Yes    Attends Archivist Meetings: More than 4 times per year    Marital Status: Married    Review of Systems   Objective:  There were no vitals taken for this visit.     08/13/2021    8:43 AM 06/26/2021   10:10 AM 06/25/2021    7:40 AM  BP/Weight  Systolic BP 379 024 097  Diastolic BP 62 68 88  Wt. (Lbs)  224 227.6  BMI  29.55 kg/m2 30.03 kg/m2    Physical Exam  Diabetic Foot Exam - Simple   No data filed      Lab Results  Component Value Date   WBC 10.2 06/25/2021   HGB 17.1 06/25/2021   HCT 51.1 (H) 06/25/2021   PLT 340 06/25/2021   GLUCOSE 98 07/03/2021   CHOL 129 06/25/2021   TRIG 105 06/25/2021   HDL 42 06/25/2021   LDLCALC 68 06/25/2021   ALT 25 07/03/2021   AST 24 07/03/2021   NA 135 07/03/2021   K 4.9 07/03/2021   CL 101 07/03/2021   CREATININE 1.07 07/03/2021   BUN 16 07/03/2021   CO2 23 07/03/2021      Assessment & Plan:   Problem List Items Addressed This Visit       Respiratory   Other emphysema ([place]) - Primary     Endocrine   Testicular hypofunction     Other   Mixed hyperlipidemia   Insomnia  .  No orders of the defined types were placed in this encounter.   No orders of the defined types were placed in this  encounter.    Follow-up: No follow-ups on file.  An After Visit Summary was printed and given to the patient.  Reinaldo Meeker, MD [facility] [phone]

## 2021-12-26 ENCOUNTER — Ambulatory Visit (INDEPENDENT_AMBULATORY_CARE_PROVIDER_SITE_OTHER): Payer: Medicare HMO | Admitting: Legal Medicine

## 2021-12-26 ENCOUNTER — Encounter: Payer: Self-pay | Admitting: Legal Medicine

## 2021-12-26 VITALS — BP 150/80 | HR 95 | Temp 97.6°F | Resp 18 | Ht 73.0 in | Wt 226.6 lb

## 2021-12-26 DIAGNOSIS — N529 Male erectile dysfunction, unspecified: Secondary | ICD-10-CM

## 2021-12-26 DIAGNOSIS — E782 Mixed hyperlipidemia: Secondary | ICD-10-CM | POA: Diagnosis not present

## 2021-12-26 DIAGNOSIS — Z1159 Encounter for screening for other viral diseases: Secondary | ICD-10-CM

## 2021-12-26 DIAGNOSIS — J438 Other emphysema: Secondary | ICD-10-CM

## 2021-12-26 DIAGNOSIS — Z6829 Body mass index (BMI) 29.0-29.9, adult: Secondary | ICD-10-CM | POA: Diagnosis not present

## 2021-12-26 DIAGNOSIS — E291 Testicular hypofunction: Secondary | ICD-10-CM | POA: Diagnosis not present

## 2021-12-26 DIAGNOSIS — G4709 Other insomnia: Secondary | ICD-10-CM | POA: Diagnosis not present

## 2021-12-26 MED ORDER — SILDENAFIL CITRATE 100 MG PO TABS: 100.0000 mg | ORAL_TABLET | Freq: Every day | ORAL | 0 refills | Status: DC | PRN

## 2021-12-26 MED ORDER — TESTOSTERONE CYPIONATE 200 MG/ML IM SOLN: 200.0000 mg | INTRAMUSCULAR | 2 refills | Status: DC

## 2021-12-27 LAB — HEPATITIS C ANTIBODY: Hep C Virus Ab: NONREACTIVE

## 2021-12-28 NOTE — Progress Notes (Signed)
Hepatitis c non reactive lp

## 2021-12-30 LAB — COMPREHENSIVE METABOLIC PANEL
ALT: 33 IU/L (ref 0–44)
AST: 27 IU/L (ref 0–40)
Albumin/Globulin Ratio: 1.7 (ref 1.2–2.2)
Albumin: 4.5 g/dL (ref 3.8–4.8)
Alkaline Phosphatase: 144 IU/L — ABNORMAL HIGH (ref 44–121)
BUN/Creatinine Ratio: 16 (ref 10–24)
BUN: 18 mg/dL (ref 8–27)
Bilirubin Total: 0.5 mg/dL (ref 0.0–1.2)
CO2: 21 mmol/L (ref 20–29)
Calcium: 9.3 mg/dL (ref 8.6–10.2)
Chloride: 99 mmol/L (ref 96–106)
Creatinine, Ser: 1.16 mg/dL (ref 0.76–1.27)
Globulin, Total: 2.7 g/dL (ref 1.5–4.5)
Glucose: 98 mg/dL (ref 70–99)
Potassium: 4.8 mmol/L (ref 3.5–5.2)
Sodium: 138 mmol/L (ref 134–144)
Total Protein: 7.2 g/dL (ref 6.0–8.5)
eGFR: 68 mL/min/{1.73_m2} (ref >59)

## 2021-12-30 LAB — CBC WITH DIFFERENTIAL/PLATELET
Basophils Absolute: 0.1 10*3/uL (ref 0.0–0.2)
Basos: 1 %
EOS (ABSOLUTE): 0.2 10*3/uL (ref 0.0–0.4)
Eos: 3 %
Hematocrit: 51.2 % — ABNORMAL HIGH (ref 37.5–51.0)
Hemoglobin: 17.5 g/dL (ref 13.0–17.7)
Immature Grans (Abs): 0.2 10*3/uL — ABNORMAL HIGH (ref 0.0–0.1)
Immature Granulocytes: 2 %
Lymphocytes Absolute: 2.5 10*3/uL (ref 0.7–3.1)
Lymphs: 30 %
MCH: 30.4 pg (ref 26.6–33.0)
MCHC: 34.2 g/dL (ref 31.5–35.7)
MCV: 89 fL (ref 79–97)
Monocytes Absolute: 0.8 10*3/uL (ref 0.1–0.9)
Monocytes: 10 %
Neutrophils Absolute: 4.5 10*3/uL (ref 1.4–7.0)
Neutrophils: 54 %
Platelets: 312 10*3/uL (ref 150–450)
RBC: 5.75 x10E6/uL (ref 4.14–5.80)
RDW: 12.6 % (ref 11.6–15.4)
WBC: 8.3 10*3/uL (ref 3.4–10.8)

## 2021-12-30 LAB — LIPID PANEL
Chol/HDL Ratio: 2.7 ratio (ref 0.0–5.0)
Cholesterol, Total: 138 mg/dL (ref 100–199)
HDL: 51 mg/dL (ref >39)
LDL Chol Calc (NIH): 71 mg/dL (ref 0–99)
Triglycerides: 80 mg/dL (ref 0–149)
VLDL Cholesterol Cal: 16 mg/dL (ref 5–40)

## 2021-12-30 LAB — TESTOSTERONE,FREE AND TOTAL
Testosterone, Free: 3.1 pg/mL — ABNORMAL LOW (ref 6.6–18.1)
Testosterone: 273 ng/dL (ref 264–916)

## 2021-12-30 LAB — CARDIOVASCULAR RISK ASSESSMENT

## 2021-12-30 LAB — PSA: Prostate Specific Ag, Serum: 2.8 ng/mL (ref 0.0–4.0)

## 2021-12-31 NOTE — Progress Notes (Signed)
Kidney and liver tests normal, hematocrit remains high, immature granulocytes, refer to hematology, testosterone 273, normal, cholesterol normal, PSA 2.8 normal,  lp

## 2022-01-01 ENCOUNTER — Other Ambulatory Visit: Payer: Self-pay

## 2022-01-01 DIAGNOSIS — R718 Other abnormality of red blood cells: Secondary | ICD-10-CM

## 2022-01-01 NOTE — Progress Notes (Signed)
Patient is ok to go ahead with referral to hematology per lab results and notes. Putting in referral order.

## 2022-01-08 ENCOUNTER — Telehealth: Payer: Self-pay | Admitting: Hematology and Oncology

## 2022-01-16 ENCOUNTER — Other Ambulatory Visit: Payer: Self-pay

## 2022-01-16 ENCOUNTER — Other Ambulatory Visit: Payer: Self-pay | Admitting: Hematology and Oncology

## 2022-01-16 DIAGNOSIS — R718 Other abnormality of red blood cells: Secondary | ICD-10-CM

## 2022-01-18 ENCOUNTER — Inpatient Hospital Stay: Payer: Medicare HMO

## 2022-01-18 ENCOUNTER — Encounter: Payer: Self-pay | Admitting: Hematology and Oncology

## 2022-01-18 ENCOUNTER — Inpatient Hospital Stay: Payer: Medicare HMO | Attending: Hematology and Oncology | Admitting: Hematology and Oncology

## 2022-01-18 VITALS — BP 148/76 | HR 84 | Temp 97.5°F | Resp 18 | Ht 73.2 in | Wt 228.4 lb

## 2022-01-18 DIAGNOSIS — R718 Other abnormality of red blood cells: Secondary | ICD-10-CM

## 2022-01-18 DIAGNOSIS — Z87891 Personal history of nicotine dependence: Secondary | ICD-10-CM

## 2022-01-18 DIAGNOSIS — R799 Abnormal finding of blood chemistry, unspecified: Secondary | ICD-10-CM | POA: Diagnosis not present

## 2022-01-18 LAB — CBC AND DIFFERENTIAL
HCT: 49 (ref 41–53)
Hemoglobin: 16.5 (ref 13.5–17.5)
Neutrophils Absolute: 5.64
Platelets: 309 10*3/uL (ref 150–400)
WBC: 9.1

## 2022-01-18 LAB — CBC
MCV: 91 (ref 80–94)
Metamyelocytes Relative: 1 % — AB (ref 0–0)
RBC: 5.39 — AB (ref 3.87–5.11)

## 2022-01-18 NOTE — Progress Notes (Cosign Needed)
[place]  [address] [place],    [address] [phone]  Clinic Day:  01/18/2022  Referring physician: Lillard Anes,*   REASON FOR CONSULTATION:  Elevated hematocrit  HISTORY OF PRESENT ILLNESS:  [name] is a 71 y.o. male with a history of elevated hematocrit dating back to July 2021.  The has never been above 53.  He is referred in consultation by Dr. Reinaldo Meeker for assessment and management.  On June 14, white blood count was 8.3 with 2% immature granulocytes, hemoglobin 17.5, hematocrit 51.2 and platelets 312,000.  In December 2022, white blood count was 10.2 with 2% immature granulocytes, hemoglobin 17.1, hematocrit 51.1 and platelets 340,000.  The patient states he generally feels well and denies complaints.  He is on testosterone for testicular hypofunction.  He has a history of allergies and asthma, but denies lung disease.  In addition to above, he has hyperlipidemia and insomnia, for which he is on medication.  He states he has a torn left bicep.  He has had rotator cuff surgery to both shoulders and reports pain of the shoulders.  He remains very active, including lifting weights and shot putting.  His last colonoscopy was in 2014 with Dr. Lyda Jester and was negative.  PSA in June was normal.  His brother has a history of stage IV lymphoma affecting the liver and spleen, but he has not sure what type.  His brother was treated with chemotherapy.     REVIEW OF SYSTEMS:  Review of Systems  Constitutional:  Negative for appetite change, chills, fatigue, fever and unexpected weight change.  HENT:   Negative for lump/mass, mouth sores and sore throat.   Respiratory:  Negative for cough and shortness of breath.   Cardiovascular:  Negative for chest pain and leg swelling.  Gastrointestinal:  Negative for abdominal pain, constipation, diarrhea, nausea and vomiting.  Genitourinary:  Negative for difficulty urinating,  dysuria, frequency and hematuria.   Musculoskeletal:  Negative for arthralgias, back pain and myalgias.  Skin:  Negative for itching, rash and wound.  Neurological:  Negative for dizziness, extremity weakness, headaches, light-headedness and numbness.  Hematological:  Negative for adenopathy.  Psychiatric/Behavioral:  Negative for depression and sleep disturbance. The patient is not nervous/anxious.      VITALS:  Blood pressure (!) 148/76, pulse 84, temperature (!) 97.5 F (36.4 C), temperature source Oral, resp. rate 18, height 6' 1.2" (1.859 m), weight 228 lb 6.4 oz (103.6 kg), SpO2 95 %.  Wt Readings from Last 3 Encounters:  01/18/22 228 lb 6.4 oz (103.6 kg)  12/26/21 226 lb 9.6 oz (102.8 kg)  06/26/21 224 lb (101.6 kg)    Body mass index is 29.97 kg/m.  Performance status (ECOG): 0 - Asymptomatic  PHYSICAL EXAM:  Physical Exam Vitals and nursing note reviewed.  Constitutional:      General: He is not in acute distress.    Appearance: Normal appearance. He is normal weight.  HENT:     Head: Normocephalic and atraumatic.     Mouth/Throat:     Mouth: Mucous membranes are moist.     Pharynx: Oropharynx is clear. No oropharyngeal exudate or posterior oropharyngeal erythema.  Eyes:     General: No scleral icterus.    Extraocular Movements: Extraocular movements intact.     Conjunctiva/sclera: Conjunctivae normal.     Pupils: Pupils are equal, round, and reactive to light.  Cardiovascular:     Rate and Rhythm: Normal rate and regular  rhythm.     Heart sounds: Normal heart sounds. No murmur heard.    No friction rub. No gallop.  Pulmonary:     Effort: Pulmonary effort is normal.     Breath sounds: Normal breath sounds. No wheezing, rhonchi or rales.  Abdominal:     General: Bowel sounds are normal. There is no distension.     Palpations: Abdomen is soft. There is no hepatomegaly, splenomegaly or mass.     Tenderness: There is no abdominal tenderness.  Musculoskeletal:         General: Normal range of motion.     Cervical back: Normal range of motion and neck supple. No tenderness.     Right lower leg: No edema.     Left lower leg: No edema.  Lymphadenopathy:     Cervical: No cervical adenopathy.     Upper Body:     Right upper body: No supraclavicular or axillary adenopathy.     Left upper body: No supraclavicular or axillary adenopathy.     Lower Body: No right inguinal adenopathy. No left inguinal adenopathy.  Skin:    General: Skin is warm and dry.     Coloration: Skin is not jaundiced.     Findings: No rash.  Neurological:     Mental Status: He is alert and oriented to person, place, and time.     Cranial Nerves: No cranial nerve deficit.  Psychiatric:        Mood and Affect: Mood normal.        Behavior: Behavior normal.        Thought Content: Thought content normal.      LABS:      Latest Ref Rng & Units 01/18/2022   12:00 AM 12/26/2021    8:29 AM 06/25/2021    8:05 AM  CBC  WBC  9.1     8.3  10.2   Hemoglobin 13.5 - 17.5 16.5     17.5  17.1   Hematocrit 41 - 53 49     51.2  51.1   Platelets 150 - 400 K/uL 309     312  340      This result is from an external source.      Latest Ref Rng & Units 12/26/2021    8:29 AM 07/03/2021    8:15 AM 06/25/2021    8:05 AM  CMP  Glucose 70 - 99 mg/dL 98  98  94   BUN 8 - 27 mg/dL '18  16  12   '$ Creatinine 0.76 - 1.27 mg/dL 1.16  1.07  1.22   Sodium 134 - 144 mmol/L 138  135  134   Potassium 3.5 - 5.2 mmol/L 4.8  4.9  5.3   Chloride 96 - 106 mmol/L 99  101  97   CO2 20 - 29 mmol/L '21  23  23   '$ Calcium 8.6 - 10.2 mg/dL 9.3  9.1  9.4   Total Protein 6.0 - 8.5 g/dL 7.2  6.7  7.2   Total Bilirubin 0.0 - 1.2 mg/dL 0.5  0.7  0.7   Alkaline Phos 44 - 121 IU/L 144  122  128   AST 0 - 40 IU/L '27  24  27   '$ ALT 0 - 44 IU/L 33  25  35      No results found for: "CEA1", "CEA" / No results found for: "CEA1", "CEA" Lab Results  Component Value Date   PSA1 2.8 12/26/2021   No  results found for:  "CAN199" No results found for: "CAN125"  No results found for: "TOTALPROTELP", "ALBUMINELP", "A1GS", "A2GS", "BETS", "BETA2SER", "GAMS", "MSPIKE", "SPEI" No results found for: "TIBC", "FERRITIN", "IRONPCTSAT" No results found for: "LDH"  STUDIES:  No results found.    HISTORY:   Past Medical History:  Diagnosis Date   Asthma    Coronavirus infection 07/15/2019   Formatting of this note might be different from the original. Notification of POS result Isolation since day of testing of patient and family members [place] Dept will call with specific instructions Conv Care their POC until the LHD contacts them Obtaining testing for symptomatic Family Members Discuss self-isolation measures Discuss in-home separation and safety measures Discuss web-based C   Insomnia    Kidney stones 13 yrs ago   Mixed hyperlipidemia    Other emphysema ([place])    PONV (postoperative nausea and vomiting)    Primary insomnia    Testicular hypofunction     Past Surgical History:  Procedure Laterality Date   bilateral rotator cuff repair  right 2004, left 2009   left ankle fx x 2     lithotripsy Right    SHOULDER ARTHROSCOPY WITH OPEN ROTATOR CUFF REPAIR AND DISTAL CLAVICLE ACROMINECTOMY  06/10/2012   Procedure: SHOULDER ARTHROSCOPY WITH OPEN ROTATOR CUFF REPAIR AND DISTAL CLAVICLE ACROMINECTOMY;  Surgeon: Magnus Sinning, MD;  Location: [facility];  Service: Orthopedics;  Laterality: Right;  LABRAL DEBRIDEMENT OPEN ANTERIOR ACROMINECTOMY/ROTATOR CUFF REPAIR with anchor    Family History  Problem Relation Age of Onset   Lymphoma Brother     Social History:  reports that he has quit smoking. His smoking use included cigarettes. He has a 2.00 pack-year smoking history. He has quit using smokeless tobacco.  His smokeless tobacco use included chew and snuff. He reports current alcohol use. He reports current drug use. Drug: Marijuana.  The patient states he drinks a maximum of 2 beers a day.  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  He occasionally smokes marijuana to help him sleep.  He is alone today.  He is married with 1 grown child and 2 grandchildren.  His daughter is a Marine scientist with cardiology.  He was born in [place] and has been in [place] since 1996.  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  He was on [employer], but resigned in 2019.  Allergies:  Allergies  Allergen Reactions   Penicillins Other (See Comments)    Unknown reaction,    Current Medications: Current Outpatient Medications  Medication Sig Dispense Refill   atorvastatin (LIPITOR) 40 MG tablet TAKE ONE TABLET BY MOUTH EVERY DAY 100 tablet 0   LORazepam (ATIVAN) 2 MG tablet      sildenafil (VIAGRA) 100 MG tablet Take 1 tablet (100 mg total) by mouth daily as needed for erectile dysfunction. 10 tablet 0   testosterone cypionate (DEPOTESTOSTERONE CYPIONATE) 200 MG/ML injection Inject 1 mL (200 mg total) into the muscle every 21 ( twenty-one) days. 10 mL 2   zolpidem (AMBIEN) 10 MG tablet Take 1 tablet (10 mg total) by mouth at bedtime as needed for sleep. 15 tablet 1   No current facility-administered medications for this visit.     ASSESSMENT & PLAN:   Assessment/Plan:  [name] is a 71 y.o. male with elevated hematocrit for about 2 years, most likely secondary to testosterone use.  He has a few abnormal  white blood cells on peripheral smear, not increased from previous reports.  He has no obvious hematologic disorder at this time.  Due to the few abnormal white blood cells, we recommend continued follow-up in our office.  I will plan to see him back in 6 months for repeat clinical assessment.  I discussed the assessment and plan with the patient.  He was provided an opportunity to ask questions and all were answered.  He agreed with the plan and demonstrated an understanding of the instructions.  He knows to contact our office if he develops concerns  prior to his next appointment.  Thank you for the this consultation.   I provided 40 minutes of face-to-face time during this encounter and > 50% was spent counseling as documented under my assessment and plan.    Marvia Pickles, PA-C

## 2022-01-22 DIAGNOSIS — M47816 Spondylosis without myelopathy or radiculopathy, lumbar region: Secondary | ICD-10-CM | POA: Diagnosis not present

## 2022-01-22 DIAGNOSIS — M5459 Other low back pain: Secondary | ICD-10-CM | POA: Diagnosis not present

## 2022-01-22 DIAGNOSIS — M4316 Spondylolisthesis, lumbar region: Secondary | ICD-10-CM | POA: Diagnosis not present

## 2022-01-22 DIAGNOSIS — M545 Low back pain, unspecified: Secondary | ICD-10-CM | POA: Diagnosis not present

## 2022-02-04 DIAGNOSIS — M545 Low back pain, unspecified: Secondary | ICD-10-CM | POA: Diagnosis not present

## 2022-02-15 DIAGNOSIS — M545 Low back pain, unspecified: Secondary | ICD-10-CM | POA: Diagnosis not present

## 2022-02-20 DIAGNOSIS — M5459 Other low back pain: Secondary | ICD-10-CM | POA: Diagnosis not present

## 2022-02-20 DIAGNOSIS — M545 Low back pain, unspecified: Secondary | ICD-10-CM | POA: Diagnosis not present

## 2022-02-26 DIAGNOSIS — M545 Low back pain, unspecified: Secondary | ICD-10-CM | POA: Diagnosis not present

## 2022-03-01 DIAGNOSIS — M545 Low back pain, unspecified: Secondary | ICD-10-CM | POA: Diagnosis not present

## 2022-03-04 DIAGNOSIS — M545 Low back pain, unspecified: Secondary | ICD-10-CM | POA: Diagnosis not present

## 2022-03-06 DIAGNOSIS — M545 Low back pain, unspecified: Secondary | ICD-10-CM | POA: Diagnosis not present

## 2022-03-11 DIAGNOSIS — M545 Low back pain, unspecified: Secondary | ICD-10-CM | POA: Diagnosis not present

## 2022-03-14 DIAGNOSIS — M545 Low back pain, unspecified: Secondary | ICD-10-CM | POA: Diagnosis not present

## 2022-03-20 DIAGNOSIS — M545 Low back pain, unspecified: Secondary | ICD-10-CM | POA: Diagnosis not present

## 2022-03-21 DIAGNOSIS — M545 Low back pain, unspecified: Secondary | ICD-10-CM | POA: Diagnosis not present

## 2022-03-27 DIAGNOSIS — M545 Low back pain, unspecified: Secondary | ICD-10-CM | POA: Diagnosis not present

## 2022-04-02 DIAGNOSIS — M5459 Other low back pain: Secondary | ICD-10-CM | POA: Diagnosis not present

## 2022-04-02 DIAGNOSIS — M545 Low back pain, unspecified: Secondary | ICD-10-CM | POA: Diagnosis not present

## 2022-04-09 DIAGNOSIS — M545 Low back pain, unspecified: Secondary | ICD-10-CM | POA: Diagnosis not present

## 2022-04-15 ENCOUNTER — Other Ambulatory Visit: Payer: Self-pay | Admitting: Family Medicine

## 2022-04-16 DIAGNOSIS — D485 Neoplasm of uncertain behavior of skin: Secondary | ICD-10-CM | POA: Diagnosis not present

## 2022-04-16 DIAGNOSIS — L821 Other seborrheic keratosis: Secondary | ICD-10-CM | POA: Diagnosis not present

## 2022-04-18 DIAGNOSIS — M545 Low back pain, unspecified: Secondary | ICD-10-CM | POA: Diagnosis not present

## 2022-04-19 DIAGNOSIS — M545 Low back pain, unspecified: Secondary | ICD-10-CM | POA: Diagnosis not present

## 2022-05-14 DIAGNOSIS — C4441 Basal cell carcinoma of skin of scalp and neck: Secondary | ICD-10-CM | POA: Diagnosis not present

## 2022-05-14 DIAGNOSIS — D0462 Carcinoma in situ of skin of left upper limb, including shoulder: Secondary | ICD-10-CM | POA: Diagnosis not present

## 2022-06-03 DIAGNOSIS — J209 Acute bronchitis, unspecified: Secondary | ICD-10-CM | POA: Diagnosis not present

## 2022-06-03 DIAGNOSIS — J019 Acute sinusitis, unspecified: Secondary | ICD-10-CM | POA: Diagnosis not present

## 2022-07-02 NOTE — Progress Notes (Unsigned)
Subjective:  Patient ID: [name], male    DOB: April 10, 1951  Age: 71 y.o. MRN: [id number]  No chief complaint on file.   Gastroesophageal Reflux He reports no abdominal pain, no chest pain, no coughing, no nausea or no sore throat. Pertinent negatives include no fatigue.  Hyperlipidemia Pertinent negatives include no chest pain, myalgias or shortness of breath.      Lipid/Cholesterol, Follow-up  Last lipid panel Other pertinent labs  Lab Results  Component Value Date   CHOL 138 12/26/2021   HDL 51 12/26/2021   LDLCALC 71 12/26/2021   TRIG 80 12/26/2021   CHOLHDL 2.7 12/26/2021   Lab Results  Component Value Date   ALT 33 12/26/2021   AST 27 12/26/2021   PLT 309 01/18/2022     He was last seen for this 6 months ago.  Management includes lipitor 40 mg daily.  He reports {excellent/good/fair/poor:19665} compliance with treatment. He {is/is not:9024} having side effects. {document side effects if present:1}  Current diet: {diet habits:16563} Current exercise: {exercise types:16438}   GERD, Follow up:  Current treatment consist of: Omeprazole  He reports excellent compliance with treatment. He is not having side effects. Marland Kitchen  He IS experiencing {Sx; ge reflux:19417}. 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 Viagra 100 mg daily, prn. Current Outpatient Medications on File Prior to Visit  Medication Sig Dispense Refill   atorvastatin (LIPITOR) 40 MG tablet TAKE 1 TABLET BY MOUTH DAILY 100 tablet 0   LORazepam (ATIVAN) 2 MG tablet      sildenafil (VIAGRA) 100 MG tablet Take 1 tablet (100 mg total) by mouth daily as needed for erectile dysfunction. 10 tablet 0   testosterone cypionate (DEPOTESTOSTERONE CYPIONATE) 200 MG/ML injection Inject 1 mL (200 mg total) into the muscle every 21 ( twenty-one) days. 10 mL 2   zolpidem (AMBIEN) 10 MG tablet Take 1 tablet (10 mg total) by mouth at  bedtime as needed for sleep. 15 tablet 1   No current facility-administered medications on file prior to visit.   Past Medical History:  Diagnosis Date   Asthma    Coronavirus infection 07/15/2019   Formatting of this note might be different from the original. Notification of POS result Isolation since day of testing of patient and family members [place] [facility] will call with specific instructions Conv Care their POC until the [facility] contacts them Obtaining testing for symptomatic Family Members Discuss self-isolation measures Discuss in-home separation and safety measures Discuss web-based C   Insomnia    Kidney stones 13 yrs ago   Mixed hyperlipidemia    Other emphysema ([place])    PONV (postoperative nausea and vomiting)    Primary insomnia    Testicular hypofunction    Past Surgical History:  Procedure Laterality Date   bilateral rotator cuff repair  right 2004, left 2009   left ankle fx x 2     lithotripsy Right    SHOULDER ARTHROSCOPY WITH OPEN ROTATOR CUFF REPAIR AND DISTAL CLAVICLE ACROMINECTOMY  06/10/2012   Procedure: SHOULDER ARTHROSCOPY WITH OPEN ROTATOR CUFF REPAIR AND DISTAL CLAVICLE ACROMINECTOMY;  Surgeon: Magnus Sinning, MD;  Location: [facility];  Service: Orthopedics;  Laterality: Right;  LABRAL DEBRIDEMENT OPEN ANTERIOR ACROMINECTOMY/ROTATOR CUFF REPAIR with anchor    Family History  Problem Relation Age of Onset   Lymphoma Brother    Social History   Socioeconomic History   Marital status: Married    Spouse  name: Opal Sidles   Number of children: 1   Years of education: 34   Highest education level: Master's degree (e.g., MA, MS, MEng, MEd, MSW, MBA)  Occupational History    Comment: retired public works  Tobacco Use   Smoking status: Former    Packs/day: 1.00    Years: 2.00    Total pack years: 2.00    Types: Cigarettes   Smokeless tobacco: Former    Types: Chew, Snuff   Tobacco comments:    quit smoking 40 yrs ago  Vaping Use   Vaping Use: Never used   Substance and Sexual Activity   Alcohol use: Yes    Comment: occasionally   Drug use: Yes    Types: Marijuana   Sexual activity: Yes  Other Topics Concern   Not on file  Social History Narrative   Not on file   Social Determinants of Health   Financial Resource Strain: Low Risk  (06/23/2020)   Overall Financial Resource Strain (CARDIA)    Difficulty of Paying Living Expenses: Not hard at all  Food Insecurity: No Food Insecurity (06/26/2021)   Hunger Vital Sign    Worried About Running Out of Food in the Last Year: Never true    [place] in the Last Year: Never true  Transportation Needs: No Transportation Needs (06/26/2021)   PRAPARE - Hydrologist (Medical): No    Lack of Transportation (Non-Medical): No  Physical Activity: Sufficiently Active (06/26/2021)   Exercise Vital Sign    Days of Exercise per Week: 5 days    Minutes of Exercise per Session: 150+ min  Stress: No Stress Concern Present (06/26/2021)   [place]    Feeling of Stress : Not at all  Social Connections: Peak (06/23/2020)   Social Connection and Isolation Panel [NHANES]    Frequency of Communication with Friends and Family: More than three times a week    Frequency of Social Gatherings with Friends and Family: Three times a week    Attends Religious Services: 1 to 4 times per year    Active Member of Clubs or Organizations: Yes    Attends Music therapist: More than 4 times per year    Marital Status: Married    Review of Systems  Constitutional:  Negative for chills, diaphoresis, fatigue and fever.  HENT:  Negative for congestion, ear pain and sore throat.   Respiratory:  Negative for cough and shortness of breath.   Cardiovascular:  Negative for chest pain and leg swelling.  Gastrointestinal:  Negative for abdominal pain, constipation, diarrhea, nausea and vomiting.   Genitourinary:  Negative for dysuria and urgency.  Musculoskeletal:  Negative for arthralgias and myalgias.  Neurological:  Negative for dizziness and headaches.  Psychiatric/Behavioral:  Negative for dysphoric mood.      Objective:  There were no vitals taken for this visit.     01/18/2022    9:06 AM 12/26/2021    8:42 AM 12/26/2021    7:58 AM  BP/Weight  Systolic BP 562 130 865  Diastolic BP 76 80 78  Wt. (Lbs) 228.4  226.6  BMI 29.97 kg/m2  29.9 kg/m2    Physical Exam  Diabetic Foot Exam - Simple   No data filed      Lab Results  Component Value Date   WBC 9.1 01/18/2022   HGB 16.5 01/18/2022   HCT 49 01/18/2022   PLT 309  01/18/2022   GLUCOSE 98 12/26/2021   CHOL 138 12/26/2021   TRIG 80 12/26/2021   HDL 51 12/26/2021   LDLCALC 71 12/26/2021   ALT 33 12/26/2021   AST 27 12/26/2021   NA 138 12/26/2021   K 4.8 12/26/2021   CL 99 12/26/2021   CREATININE 1.16 12/26/2021   BUN 18 12/26/2021   CO2 21 12/26/2021      Assessment & Plan:   Problem List Items Addressed This Visit   None .  No orders of the defined types were placed in this encounter.   No orders of the defined types were placed in this encounter.    Follow-up: No follow-ups on file.  An After Visit Summary was printed and given to the patient.  Rip Harbour, NP [place] [phone]

## 2022-07-03 ENCOUNTER — Ambulatory Visit (INDEPENDENT_AMBULATORY_CARE_PROVIDER_SITE_OTHER): Payer: Medicare HMO | Admitting: Nurse Practitioner

## 2022-07-03 ENCOUNTER — Encounter: Payer: Self-pay | Admitting: Nurse Practitioner

## 2022-07-03 ENCOUNTER — Other Ambulatory Visit: Payer: Self-pay | Admitting: Nurse Practitioner

## 2022-07-03 VITALS — BP 118/72 | HR 74 | Temp 96.9°F | Ht 73.25 in | Wt 223.0 lb

## 2022-07-03 DIAGNOSIS — K219 Gastro-esophageal reflux disease without esophagitis: Secondary | ICD-10-CM

## 2022-07-03 DIAGNOSIS — E782 Mixed hyperlipidemia: Secondary | ICD-10-CM

## 2022-07-03 DIAGNOSIS — E291 Testicular hypofunction: Secondary | ICD-10-CM

## 2022-07-03 DIAGNOSIS — F5101 Primary insomnia: Secondary | ICD-10-CM

## 2022-07-03 DIAGNOSIS — G4709 Other insomnia: Secondary | ICD-10-CM

## 2022-07-03 MED ORDER — TESTOSTERONE CYPIONATE 200 MG/ML IM SOLN: 200.0000 mg | INTRAMUSCULAR | 2 refills | Status: DC

## 2022-07-03 MED ORDER — ZOLPIDEM TARTRATE 10 MG PO TABS: 10.0000 mg | ORAL_TABLET | Freq: Every evening | ORAL | 1 refills | Status: DC | PRN

## 2022-07-03 NOTE — Patient Instructions (Addendum)
We will call you with lab results Continue medications Follow-up in 28-month, fasting   Preventive Care 65 Years and Older, Male Preventive care refers to lifestyle choices and visits with your health care provider that can promote health and wellness. Preventive care visits are also called wellness exams. What can I expect for my preventive care visit? Counseling During your preventive care visit, your health care provider may ask about your: Medical history, including: Past medical problems. Family medical history. History of falls. Current health, including: Emotional well-being. Home life and relationship well-being. Sexual activity. Memory and ability to understand (cognition). Lifestyle, including: Alcohol, nicotine or tobacco, and drug use. Access to firearms. Diet, exercise, and sleep habits. Work and work eStatistician Sunscreen use. Safety issues such as seatbelt and bike helmet use. Physical exam Your health care provider will check your: Height and weight. These may be used to calculate your BMI (body mass index). BMI is a measurement that tells if you are at a healthy weight. Waist circumference. This measures the distance around your waistline. This measurement also tells if you are at a healthy weight and may help predict your risk of certain diseases, such as type 2 diabetes and high blood pressure. Heart rate and blood pressure. Body temperature. Skin for abnormal spots. What immunizations do I need?  Vaccines are usually given at various ages, according to a schedule. Your health care provider will recommend vaccines for you based on your age, medical history, and lifestyle or other factors, such as travel or where you work. What tests do I need? Screening Your health care provider may recommend screening tests for certain conditions. This may include: Lipid and cholesterol levels. Diabetes screening. This is done by checking your blood sugar (glucose) after  you have not eaten for a while (fasting). Hepatitis C test. Hepatitis B test. HIV (human immunodeficiency virus) test. STI (sexually transmitted infection) testing, if you are at risk. Lung cancer screening. Colorectal cancer screening. Prostate cancer screening. Abdominal aortic aneurysm (AAA) screening. You may need this if you are a current or former smoker. Talk with your health care provider about your test results, treatment options, and if necessary, the need for more tests. Follow these instructions at home: Eating and drinking  Eat a diet that includes fresh fruits and vegetables, whole grains, lean protein, and low-fat dairy products. Limit your intake of foods with high amounts of sugar, saturated fats, and salt. Take vitamin and mineral supplements as recommended by your health care provider. Do not drink alcohol if your health care provider tells you not to drink. If you drink alcohol: Limit how much you have to 0-2 drinks a day. Know how much alcohol is in your drink. In the [place] one drink equals one 12 oz bottle of beer (355 mL), one 5 oz glass of wine (148 mL), or one 1 oz glass of hard liquor (44 mL). Lifestyle Brush your teeth every morning and night with fluoride toothpaste. Floss one time each day. Exercise for at least 30 minutes 5 or more days each week. Do not use any products that contain nicotine or tobacco. These products include cigarettes, chewing tobacco, and vaping devices, such as e-cigarettes. If you need help quitting, ask your health care provider. Do not use drugs. If you are sexually active, practice safe sex. Use a condom or other form of protection to prevent STIs. Take aspirin only as told by your health care provider. Make sure that you understand how much to take and what form  to take. Work with your health care provider to find out whether it is safe and beneficial for you to take aspirin daily. Ask your health care provider if you need to take a  cholesterol-lowering medicine (statin). Find healthy ways to manage stress, such as: Meditation, yoga, or listening to music. Journaling. Talking to a trusted person. Spending time with friends and family. Safety Always wear your seat belt while driving or riding in a vehicle. Do not drive: If you have been drinking alcohol. Do not ride with someone who has been drinking. When you are tired or distracted. While texting. If you have been using any mind-altering substances or drugs. Wear a helmet and other protective equipment during sports activities. If you have firearms in your house, make sure you follow all gun safety procedures. Minimize exposure to UV radiation to reduce your risk of skin cancer. What's next? Visit your health care provider once a year for an annual wellness visit. Ask your health care provider how often you should have your eyes and teeth checked. Stay up to date on all vaccines. This information is not intended to replace advice given to you by your health care provider. Make sure you discuss any questions you have with your health care provider. Document Revised: 12/27/2020 Document Reviewed: 12/27/2020 Elsevier Patient Education  [place].

## 2022-07-04 LAB — PSA: Prostate Specific Ag, Serum: 3 ng/mL (ref 0.0–4.0)

## 2022-07-05 LAB — CBC WITH DIFFERENTIAL/PLATELET
Basophils Absolute: 0.1 10*3/uL (ref 0.0–0.2)
Basos: 1 %
EOS (ABSOLUTE): 0.3 10*3/uL (ref 0.0–0.4)
Eos: 4 %
Hematocrit: 52 % — ABNORMAL HIGH (ref 37.5–51.0)
Hemoglobin: 17.4 g/dL (ref 13.0–17.7)
Immature Grans (Abs): 0.1 10*3/uL (ref 0.0–0.1)
Immature Granulocytes: 2 %
Lymphocytes Absolute: 2.8 10*3/uL (ref 0.7–3.1)
Lymphs: 33 %
MCH: 30.5 pg (ref 26.6–33.0)
MCHC: 33.5 g/dL (ref 31.5–35.7)
MCV: 91 fL (ref 79–97)
Monocytes Absolute: 0.8 10*3/uL (ref 0.1–0.9)
Monocytes: 10 %
Neutrophils Absolute: 4.1 10*3/uL (ref 1.4–7.0)
Neutrophils: 50 %
Platelets: 312 10*3/uL (ref 150–450)
RBC: 5.71 x10E6/uL (ref 4.14–5.80)
RDW: 12.5 % (ref 11.6–15.4)
WBC: 8.3 10*3/uL (ref 3.4–10.8)

## 2022-07-05 LAB — COMPREHENSIVE METABOLIC PANEL
ALT: 34 IU/L (ref 0–44)
AST: 27 IU/L (ref 0–40)
Albumin/Globulin Ratio: 1.8 (ref 1.2–2.2)
Albumin: 4.7 g/dL (ref 3.8–4.8)
Alkaline Phosphatase: 124 IU/L — ABNORMAL HIGH (ref 44–121)
BUN/Creatinine Ratio: 13 (ref 10–24)
BUN: 17 mg/dL (ref 8–27)
Bilirubin Total: 0.5 mg/dL (ref 0.0–1.2)
CO2: 21 mmol/L (ref 20–29)
Calcium: 9.5 mg/dL (ref 8.6–10.2)
Chloride: 99 mmol/L (ref 96–106)
Creatinine, Ser: 1.27 mg/dL (ref 0.76–1.27)
Globulin, Total: 2.6 g/dL (ref 1.5–4.5)
Glucose: 100 mg/dL — ABNORMAL HIGH (ref 70–99)
Potassium: 5.3 mmol/L — ABNORMAL HIGH (ref 3.5–5.2)
Sodium: 136 mmol/L (ref 134–144)
Total Protein: 7.3 g/dL (ref 6.0–8.5)
eGFR: 60 mL/min/{1.73_m2} (ref >59)

## 2022-07-05 LAB — CARDIOVASCULAR RISK ASSESSMENT

## 2022-07-05 LAB — LIPID PANEL
Chol/HDL Ratio: 2.3 ratio (ref 0.0–5.0)
Cholesterol, Total: 136 mg/dL (ref 100–199)
HDL: 60 mg/dL (ref >39)
LDL Chol Calc (NIH): 61 mg/dL (ref 0–99)
Triglycerides: 75 mg/dL (ref 0–149)
VLDL Cholesterol Cal: 15 mg/dL (ref 5–40)

## 2022-07-05 LAB — TESTOSTERONE,FREE AND TOTAL
Testosterone, Free: 1.9 pg/mL — ABNORMAL LOW (ref 6.6–18.1)
Testosterone: 191 ng/dL — ABNORMAL LOW (ref 264–916)

## 2022-07-18 ENCOUNTER — Other Ambulatory Visit: Payer: Self-pay | Admitting: Hematology and Oncology

## 2022-07-18 DIAGNOSIS — M5136 Other intervertebral disc degeneration, lumbar region: Secondary | ICD-10-CM | POA: Diagnosis not present

## 2022-07-18 DIAGNOSIS — R799 Abnormal finding of blood chemistry, unspecified: Secondary | ICD-10-CM

## 2022-07-18 DIAGNOSIS — M9902 Segmental and somatic dysfunction of thoracic region: Secondary | ICD-10-CM | POA: Diagnosis not present

## 2022-07-18 DIAGNOSIS — M9903 Segmental and somatic dysfunction of lumbar region: Secondary | ICD-10-CM | POA: Diagnosis not present

## 2022-07-18 DIAGNOSIS — M9905 Segmental and somatic dysfunction of pelvic region: Secondary | ICD-10-CM | POA: Diagnosis not present

## 2022-07-18 DIAGNOSIS — R718 Other abnormality of red blood cells: Secondary | ICD-10-CM

## 2022-07-18 NOTE — Progress Notes (Deleted)
[facility]  [address],  [place]  [address] [phone]  Clinic Day:  07/18/2022  Referring physician: Lillard Anes,*   HISTORY OF PRESENT ILLNESS:  The patient is a 72 y.o. 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  He is here today for repeat clinical assessment, as you said.  Previous peripheral smears.  PHYSICAL EXAM:  There were no vitals taken for this visit. Wt Readings from Last 3 Encounters:  07/03/22 223 lb (101.2 kg)  01/18/22 228 lb 6.4 oz (103.6 kg)  12/26/21 226 lb 9.6 oz (102.8 kg)   There is no height or weight on file to calculate BMI.  Performance status (ECOG): {CHL ONC [id number]  Physical Exam Vitals and nursing note reviewed.  Constitutional:      General: He is not in acute distress.    Appearance: Normal appearance. He is normal weight.  HENT:     Head: Normocephalic and atraumatic.     Mouth/Throat:     Mouth: Mucous membranes are moist.     Pharynx: Oropharynx is clear. No oropharyngeal exudate or posterior oropharyngeal erythema.  Eyes:     General: No scleral icterus.    Extraocular Movements: Extraocular movements intact.     Conjunctiva/sclera: Conjunctivae normal.     Pupils: Pupils are equal, round, and reactive to light.  Cardiovascular:     Rate and Rhythm: Normal rate and regular rhythm.     Heart sounds: Normal heart sounds. No murmur heard.    No friction rub. No gallop.  Pulmonary:     Effort: Pulmonary effort is normal.     Breath sounds: Normal breath sounds. No wheezing, rhonchi or rales.  Abdominal:     General: Bowel sounds are normal. There is no distension.     Palpations: Abdomen is soft. There is no hepatomegaly, splenomegaly or mass.     Tenderness: There is no abdominal tenderness.  Musculoskeletal:        General: Normal range of motion.     Cervical back: Normal range of motion and neck supple. No tenderness.     Right lower leg: No  edema.     Left lower leg: No edema.  Lymphadenopathy:     Cervical: No cervical adenopathy.     Upper Body:     Right upper body: No supraclavicular or axillary adenopathy.     Left upper body: No supraclavicular or axillary adenopathy.     Lower Body: No right inguinal adenopathy. No left inguinal adenopathy.  Skin:    General: Skin is warm and dry.     Coloration: Skin is not jaundiced.     Findings: No rash.  Neurological:     Mental Status: He is alert and oriented to person, place, and time.     Cranial Nerves: No cranial nerve deficit.  Psychiatric:        Mood and Affect: Mood normal.        Behavior: Behavior normal.        Thought Content: Thought content normal.     LABS:      Latest Ref Rng & Units 07/03/2022    7:55 AM 01/18/2022   12:00 AM 12/26/2021    8:29 AM  CBC  WBC 3.4 - 10.8 x10E3/uL 8.3  9.1     8.3   Hemoglobin 13.0 - 17.7 g/dL 17.4  16.5     17.5   Hematocrit 37.5 - 51.0 % 52.0  49     51.2   Platelets 150 - 450 x10E3/uL 312  309     312      This result is from an external source.      Latest Ref Rng & Units 07/03/2022    7:55 AM 12/26/2021    8:29 AM 07/03/2021    8:15 AM  CMP  Glucose 70 - 99 mg/dL 100  98  98   BUN 8 - 27 mg/dL '17  18  16   '$ Creatinine 0.76 - 1.27 mg/dL 1.27  1.16  1.07   Sodium 134 - 144 mmol/L 136  138  135   Potassium 3.5 - 5.2 mmol/L 5.3  4.8  4.9   Chloride 96 - 106 mmol/L 99  99  101   CO2 20 - 29 mmol/L '21  21  23   '$ Calcium 8.6 - 10.2 mg/dL 9.5  9.3  9.1   Total Protein 6.0 - 8.5 g/dL 7.3  7.2  6.7   Total Bilirubin 0.0 - 1.2 mg/dL 0.5  0.5  0.7   Alkaline Phos 44 - 121 IU/L 124  144  122   AST 0 - 40 IU/L '27  27  24   '$ ALT 0 - 44 IU/L 34  33  25      No results found for: "CEA1", "CEA" / No results found for: "CEA1", "CEA" Lab Results  Component Value Date   PSA1 3.0 07/03/2022   No results found for: "HOZ224" No results found for: "CAN125"  No results found for: "TOTALPROTELP", "ALBUMINELP", "A1GS",  "A2GS", "BETS", "BETA2SER", "GAMS", "MSPIKE", "SPEI" No results found for: "TIBC", "FERRITIN", "IRONPCTSAT" No results found for: "LDH"     Component Value Date/Time   PSA1 3.0 07/03/2022 0913    Review Flowsheet  More data exists      Latest Ref Rng & Units 07/03/2021 12/26/2021 07/03/2022  Oncology Labs  Prostate Specific Ag, Serum 0.0 - 4.0 ng/mL 2.1  2.8  3.0      STUDIES:  No results found.    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  The patient understands all the plans discussed today and is in agreement with them.  He knows to contact our office if he develops concerns prior to his next appointment    Marvia Pickles, PA-C

## 2022-07-22 ENCOUNTER — Other Ambulatory Visit: Payer: Medicare HMO

## 2022-07-22 ENCOUNTER — Ambulatory Visit: Payer: Medicare HMO | Admitting: Hematology and Oncology

## 2022-07-23 DIAGNOSIS — M5136 Other intervertebral disc degeneration, lumbar region: Secondary | ICD-10-CM | POA: Diagnosis not present

## 2022-07-23 DIAGNOSIS — M9903 Segmental and somatic dysfunction of lumbar region: Secondary | ICD-10-CM | POA: Diagnosis not present

## 2022-07-23 DIAGNOSIS — M9902 Segmental and somatic dysfunction of thoracic region: Secondary | ICD-10-CM | POA: Diagnosis not present

## 2022-07-23 DIAGNOSIS — M9905 Segmental and somatic dysfunction of pelvic region: Secondary | ICD-10-CM | POA: Diagnosis not present

## 2022-07-27 DIAGNOSIS — M545 Low back pain, unspecified: Secondary | ICD-10-CM | POA: Diagnosis not present

## 2022-07-30 DIAGNOSIS — K047 Periapical abscess without sinus: Secondary | ICD-10-CM | POA: Diagnosis not present

## 2022-08-01 ENCOUNTER — Other Ambulatory Visit: Payer: Self-pay | Admitting: Family Medicine

## 2022-08-02 DIAGNOSIS — M5134 Other intervertebral disc degeneration, thoracic region: Secondary | ICD-10-CM | POA: Diagnosis not present

## 2022-08-02 DIAGNOSIS — M5137 Other intervertebral disc degeneration, lumbosacral region: Secondary | ICD-10-CM | POA: Diagnosis not present

## 2022-08-02 DIAGNOSIS — M5136 Other intervertebral disc degeneration, lumbar region: Secondary | ICD-10-CM | POA: Diagnosis not present

## 2022-08-02 DIAGNOSIS — M47816 Spondylosis without myelopathy or radiculopathy, lumbar region: Secondary | ICD-10-CM | POA: Diagnosis not present

## 2022-08-02 DIAGNOSIS — M5135 Other intervertebral disc degeneration, thoracolumbar region: Secondary | ICD-10-CM | POA: Diagnosis not present

## 2022-08-02 DIAGNOSIS — M439 Deforming dorsopathy, unspecified: Secondary | ICD-10-CM | POA: Diagnosis not present

## 2022-08-02 DIAGNOSIS — M48061 Spinal stenosis, lumbar region without neurogenic claudication: Secondary | ICD-10-CM | POA: Diagnosis not present

## 2022-08-05 NOTE — Progress Notes (Deleted)
[name]  [address] [place],  [place]  [address] [phone]  Clinic Day:  08/05/2022  Referring physician: Lillard Anes,*   HISTORY OF PRESENT ILLNESS:  The patient is a 72 y.o. male with elevated hemoglobin who I began seeing in July.  This was felt to be due to testosterone use.  He is here for repeat clinical assessment, as he had a few a atypical granulocytes on peripheral smear.  PHYSICAL EXAM:  There were no vitals taken for this visit. Wt Readings from Last 3 Encounters:  07/03/22 223 lb (101.2 kg)  01/18/22 228 lb 6.4 oz (103.6 kg)  12/26/21 226 lb 9.6 oz (102.8 kg)   There is no height or weight on file to calculate BMI.  Performance status (ECOG): {CHL ONC [id number]  Physical Exam Vitals and nursing note reviewed.  Constitutional:      General: He is not in acute distress.    Appearance: Normal appearance. He is normal weight.  HENT:     Head: Normocephalic and atraumatic.     Mouth/Throat:     Mouth: Mucous membranes are moist.     Pharynx: Oropharynx is clear. No oropharyngeal exudate or posterior oropharyngeal erythema.  Eyes:     General: No scleral icterus.    Extraocular Movements: Extraocular movements intact.     Conjunctiva/sclera: Conjunctivae normal.     Pupils: Pupils are equal, round, and reactive to light.  Cardiovascular:     Rate and Rhythm: Normal rate and regular rhythm.     Heart sounds: Normal heart sounds. No murmur heard.    No friction rub. No gallop.  Pulmonary:     Effort: Pulmonary effort is normal.     Breath sounds: Normal breath sounds. No wheezing, rhonchi or rales.  Abdominal:     General: Bowel sounds are normal. There is no distension.     Palpations: Abdomen is soft. There is no hepatomegaly, splenomegaly or mass.     Tenderness: There is no abdominal tenderness.  Musculoskeletal:        General: Normal range of motion.     Cervical back: Normal range of motion and  neck supple. No tenderness.     Right lower leg: No edema.     Left lower leg: No edema.  Lymphadenopathy:     Cervical: No cervical adenopathy.     Upper Body:     Right upper body: No supraclavicular or axillary adenopathy.     Left upper body: No supraclavicular or axillary adenopathy.     Lower Body: No right inguinal adenopathy. No left inguinal adenopathy.  Skin:    General: Skin is warm and dry.     Coloration: Skin is not jaundiced.     Findings: No rash.  Neurological:     Mental Status: He is alert and oriented to person, place, and time.     Cranial Nerves: No cranial nerve deficit.  Psychiatric:        Mood and Affect: Mood normal.        Behavior: Behavior normal.        Thought Content: Thought content normal.     LABS:      Latest Ref Rng & Units 07/03/2022    7:55 AM 01/18/2022   12:00 AM 12/26/2021    8:29 AM  CBC  WBC 3.4 - 10.8 x10E3/uL 8.3  9.1     8.3   Hemoglobin 13.0 - 17.7 g/dL 17.4  16.5  17.5   Hematocrit 37.5 - 51.0 % 52.0  49     51.2   Platelets 150 - 450 x10E3/uL 312  309     312      This result is from an external source.      Latest Ref Rng & Units 07/03/2022    7:55 AM 12/26/2021    8:29 AM 07/03/2021    8:15 AM  CMP  Glucose 70 - 99 mg/dL 100  98  98   BUN 8 - 27 mg/dL 17  18  16   $ Creatinine 0.76 - 1.27 mg/dL 1.27  1.16  1.07   Sodium 134 - 144 mmol/L 136  138  135   Potassium 3.5 - 5.2 mmol/L 5.3  4.8  4.9   Chloride 96 - 106 mmol/L 99  99  101   CO2 20 - 29 mmol/L 21  21  23   $ Calcium 8.6 - 10.2 mg/dL 9.5  9.3  9.1   Total Protein 6.0 - 8.5 g/dL 7.3  7.2  6.7   Total Bilirubin 0.0 - 1.2 mg/dL 0.5  0.5  0.7   Alkaline Phos 44 - 121 IU/L 124  144  122   AST 0 - 40 IU/L 27  27  24   $ ALT 0 - 44 IU/L 34  33  25      No results found for: "CEA1", "CEA" / No results found for: "CEA1", "CEA" Lab Results  Component Value Date   PSA1 3.0 07/03/2022   No results found for: "[id number]" No results found for: "CAN125"  No results  found for: "TOTALPROTELP", "ALBUMINELP", "A1GS", "A2GS", "BETS", "BETA2SER", "GAMS", "MSPIKE", "SPEI" No results found for: "TIBC", "FERRITIN", "IRONPCTSAT" No results found for: "LDH"     Component Value Date/Time   PSA1 3.0 07/03/2022 0913    Review Flowsheet  More data exists      Latest Ref Rng & Units 07/03/2021 12/26/2021 07/03/2022  Oncology Labs  Prostate Specific Ag, Serum 0.0 - 4.0 ng/mL 2.1  2.8  3.0      STUDIES:  No results found.    ASSESSMENT & PLAN:   Assessment/Plan:  72 y.o. male with elevated hematocrit most likely due to testosterone use.  He has had a few atypical granulocytes without elevation of his total white blood cell count.  The patient understands all the plans discussed today and is in agreement with them.  He knows to contact our office if he develops concerns prior to his next appointment    Marvia Pickles, PA-C

## 2022-08-06 ENCOUNTER — Inpatient Hospital Stay: Payer: Medicare HMO | Admitting: Hematology and Oncology

## 2022-08-06 ENCOUNTER — Inpatient Hospital Stay: Payer: Medicare HMO

## 2022-08-13 DIAGNOSIS — M545 Low back pain, unspecified: Secondary | ICD-10-CM | POA: Diagnosis not present

## 2022-08-29 DIAGNOSIS — K047 Periapical abscess without sinus: Secondary | ICD-10-CM | POA: Diagnosis not present

## 2022-09-03 ENCOUNTER — Other Ambulatory Visit: Payer: Self-pay

## 2022-09-03 DIAGNOSIS — F5101 Primary insomnia: Secondary | ICD-10-CM

## 2022-09-03 MED ORDER — ZOLPIDEM TARTRATE 10 MG PO TABS: 10.0000 mg | ORAL_TABLET | Freq: Every evening | ORAL | 1 refills | Status: DC | PRN

## 2022-09-05 DIAGNOSIS — L570 Actinic keratosis: Secondary | ICD-10-CM | POA: Diagnosis not present

## 2022-09-05 DIAGNOSIS — D2239 Melanocytic nevi of other parts of face: Secondary | ICD-10-CM | POA: Diagnosis not present

## 2022-09-05 DIAGNOSIS — D225 Melanocytic nevi of trunk: Secondary | ICD-10-CM | POA: Diagnosis not present

## 2022-09-05 DIAGNOSIS — L821 Other seborrheic keratosis: Secondary | ICD-10-CM | POA: Diagnosis not present

## 2022-09-19 DIAGNOSIS — M5416 Radiculopathy, lumbar region: Secondary | ICD-10-CM | POA: Diagnosis not present

## 2022-10-24 DIAGNOSIS — M19011 Primary osteoarthritis, right shoulder: Secondary | ICD-10-CM | POA: Diagnosis not present

## 2022-11-07 ENCOUNTER — Other Ambulatory Visit: Payer: Self-pay

## 2022-11-07 DIAGNOSIS — F5101 Primary insomnia: Secondary | ICD-10-CM

## 2022-11-08 MED ORDER — ZOLPIDEM TARTRATE 10 MG PO TABS: 10.0000 mg | ORAL_TABLET | Freq: Every evening | ORAL | 1 refills | Status: DC | PRN

## 2022-11-25 ENCOUNTER — Other Ambulatory Visit: Payer: Self-pay

## 2022-11-25 MED ORDER — ATORVASTATIN CALCIUM 40 MG PO TABS: 40.0000 mg | ORAL_TABLET | Freq: Every day | ORAL | 0 refills | Status: DC

## 2022-11-26 DIAGNOSIS — J3081 Allergic rhinitis due to animal (cat) (dog) hair and dander: Secondary | ICD-10-CM | POA: Diagnosis not present

## 2022-12-02 DIAGNOSIS — J019 Acute sinusitis, unspecified: Secondary | ICD-10-CM | POA: Diagnosis not present

## 2023-01-03 ENCOUNTER — Encounter: Payer: Self-pay | Admitting: Physician Assistant

## 2023-01-03 ENCOUNTER — Ambulatory Visit (INDEPENDENT_AMBULATORY_CARE_PROVIDER_SITE_OTHER): Payer: Medicare HMO | Admitting: Physician Assistant

## 2023-01-03 VITALS — BP 134/80 | HR 67 | Temp 97.1°F | Ht 73.0 in | Wt 225.0 lb

## 2023-01-03 DIAGNOSIS — Z1211 Encounter for screening for malignant neoplasm of colon: Secondary | ICD-10-CM

## 2023-01-03 DIAGNOSIS — E291 Testicular hypofunction: Secondary | ICD-10-CM | POA: Diagnosis not present

## 2023-01-03 DIAGNOSIS — Z125 Encounter for screening for malignant neoplasm of prostate: Secondary | ICD-10-CM | POA: Diagnosis not present

## 2023-01-03 DIAGNOSIS — F5101 Primary insomnia: Secondary | ICD-10-CM | POA: Diagnosis not present

## 2023-01-03 DIAGNOSIS — Z1212 Encounter for screening for malignant neoplasm of rectum: Secondary | ICD-10-CM | POA: Diagnosis not present

## 2023-01-03 DIAGNOSIS — E782 Mixed hyperlipidemia: Secondary | ICD-10-CM | POA: Diagnosis not present

## 2023-01-03 DIAGNOSIS — N529 Male erectile dysfunction, unspecified: Secondary | ICD-10-CM | POA: Diagnosis not present

## 2023-01-03 MED ORDER — SILDENAFIL CITRATE 100 MG PO TABS
100.0000 mg | ORAL_TABLET | Freq: Every day | ORAL | 0 refills | Status: DC | PRN
Start: 2023-01-03 — End: 2024-02-03

## 2023-01-03 MED ORDER — TESTOSTERONE CYPIONATE 200 MG/ML IM SOLN
200.0000 mg | INTRAMUSCULAR | 2 refills | Status: DC
Start: 2023-01-03 — End: 2023-08-13

## 2023-01-03 MED ORDER — ZOLPIDEM TARTRATE 10 MG PO TABS
10.0000 mg | ORAL_TABLET | Freq: Every evening | ORAL | 1 refills | Status: DC | PRN
Start: 2023-01-03 — End: 2023-08-13

## 2023-01-03 NOTE — Assessment & Plan Note (Signed)
Yearly screening

## 2023-01-03 NOTE — Assessment & Plan Note (Addendum)
Controlled Takes Testosterone Cypionate 200mg  injection Denies any side effects or issues with the injections Labs drawn today Will adjust medication depending on labs Lab Results  Component Value Date   TESTOSTERONE 191 (L) 07/03/2022

## 2023-01-03 NOTE — Assessment & Plan Note (Signed)
Well controlled.  Continue to work on eating a healthy diet and exercise.  Labs drawn today.   No major side effects reported, and no issues with compliance. The current medical regimen is effective;  continue present plan with Atorvastatin 40mg  Will adjust medication as needed depending on labs Lab Results  Component Value Date   LDLCALC 61 07/03/2022

## 2023-01-03 NOTE — Assessment & Plan Note (Signed)
Controlled Currently taking Ambien and Melatonin to help with sleep No major side effects or issues taking the medication Continue taking as directed.
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
## 2023-01-03 NOTE — Assessment & Plan Note (Signed)
Controlled Denies any side effects or issues with the medication Takes Sildenafil Citrate 100mg  Continue taking as directed

## 2023-01-04 LAB — PSA, TOTAL AND FREE
PSA, Free Pct: 21 %
PSA, Free: 0.63 ng/mL
Prostate Specific Ag, Serum: 3 ng/mL (ref 0.0–4.0)

## 2023-01-06 LAB — CBC WITH DIFFERENTIAL/PLATELET
Basophils Absolute: 0.1 10*3/uL (ref 0.0–0.2)
Basos: 2 %
EOS (ABSOLUTE): 0.2 10*3/uL (ref 0.0–0.4)
Eos: 3 %
Hematocrit: 49.5 % (ref 37.5–51.0)
Hemoglobin: 16.6 g/dL (ref 13.0–17.7)
Immature Grans (Abs): 0.1 10*3/uL (ref 0.0–0.1)
Immature Granulocytes: 2 %
Lymphocytes Absolute: 2 10*3/uL (ref 0.7–3.1)
Lymphs: 30 %
MCH: 30.6 pg (ref 26.6–33.0)
MCHC: 33.5 g/dL (ref 31.5–35.7)
MCV: 91 fL (ref 79–97)
Monocytes Absolute: 0.7 10*3/uL (ref 0.1–0.9)
Monocytes: 10 %
Neutrophils Absolute: 3.6 10*3/uL (ref 1.4–7.0)
Neutrophils: 53 %
Platelets: 297 10*3/uL (ref 150–450)
RBC: 5.42 x10E6/uL (ref 4.14–5.80)
RDW: 12.8 % (ref 11.6–15.4)
WBC: 6.7 10*3/uL (ref 3.4–10.8)

## 2023-01-06 LAB — CMP14+EGFR
ALT: 39 IU/L (ref 0–44)
AST: 30 IU/L (ref 0–40)
Albumin: 4.2 g/dL (ref 3.8–4.8)
Alkaline Phosphatase: 123 IU/L — ABNORMAL HIGH (ref 44–121)
BUN/Creatinine Ratio: 17 (ref 10–24)
BUN: 20 mg/dL (ref 8–27)
Bilirubin Total: 0.7 mg/dL (ref 0.0–1.2)
CO2: 23 mmol/L (ref 20–29)
Calcium: 9.4 mg/dL (ref 8.6–10.2)
Chloride: 100 mmol/L (ref 96–106)
Creatinine, Ser: 1.15 mg/dL (ref 0.76–1.27)
Globulin, Total: 2.6 g/dL (ref 1.5–4.5)
Glucose: 99 mg/dL (ref 70–99)
Potassium: 4.9 mmol/L (ref 3.5–5.2)
Sodium: 136 mmol/L (ref 134–144)
Total Protein: 6.8 g/dL (ref 6.0–8.5)
eGFR: 68 mL/min/{1.73_m2} (ref >59)

## 2023-01-06 LAB — LIPID PANEL
Chol/HDL Ratio: 2.5 ratio (ref 0.0–5.0)
Cholesterol, Total: 139 mg/dL (ref 100–199)
HDL: 55 mg/dL (ref >39)
LDL Chol Calc (NIH): 69 mg/dL (ref 0–99)
Triglycerides: 77 mg/dL (ref 0–149)
VLDL Cholesterol Cal: 15 mg/dL (ref 5–40)

## 2023-01-06 LAB — TESTOSTERONE,FREE AND TOTAL
Testosterone, Free: 0.9 pg/mL — ABNORMAL LOW (ref 6.6–18.1)
Testosterone: 121 ng/dL — ABNORMAL LOW (ref 264–916)

## 2023-01-21 ENCOUNTER — Inpatient Hospital Stay: Payer: Medicare HMO | Admitting: Hematology and Oncology

## 2023-01-21 ENCOUNTER — Inpatient Hospital Stay: Payer: Medicare HMO

## 2023-01-30 ENCOUNTER — Ambulatory Visit (INDEPENDENT_AMBULATORY_CARE_PROVIDER_SITE_OTHER): Payer: Medicare HMO

## 2023-01-30 VITALS — Ht 73.0 in | Wt 224.0 lb

## 2023-01-30 DIAGNOSIS — Z0000 Encounter for general adult medical examination without abnormal findings: Secondary | ICD-10-CM | POA: Diagnosis not present

## 2023-01-30 IMAGING — CT CT CARDIAC CORONARY ARTERY CALCIUM SCORE
3 series · 14 of 20 positions shown, 15 images · non-contrast
Comparison: Chest CTA 10/14/2017.
COMPARISON: Chest CTA 10/14/2017.

Addendum:
EXAM:
OVER-READ INTERPRETATION  CT CHEST

The following report is an over-read performed by radiologist Dr.
Promocja Hazuka [REDACTED] on 11/07/2020. This
over-read does not include interpretation of cardiac or coronary
anatomy or pathology. The coronary calcium score interpretation by
the cardiologist is attached.
CLINICAL DATA: 69M for Cardiovascular Disease Risk stratification
Coronary Calcium Score
TECHNIQUE: A gated, non-contrast computed tomography scan of the heart was
performed using 3mm slice thickness. Axial images were analyzed on a
dedicated workstation. Calcium scoring of the coronary arteries was
performed using the Agatston method.

[Series 2: casc 3.0 bv41 2 bestsyst 35 % · axial · 0.48mm/px · z∈[-290,-212]mm · 4 of 44 slices shown, 5 images]
[im 9/44  vessel]
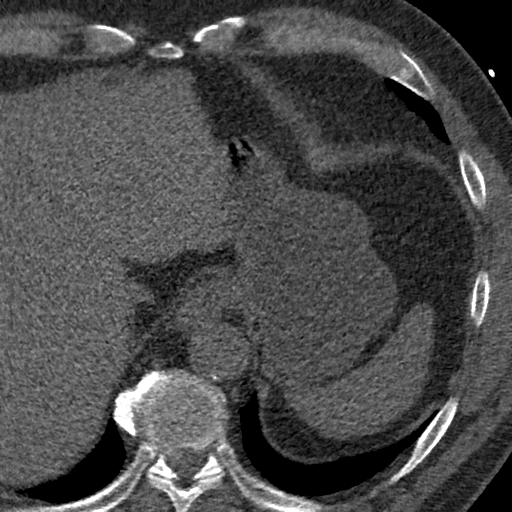
[im 9/44  lung]
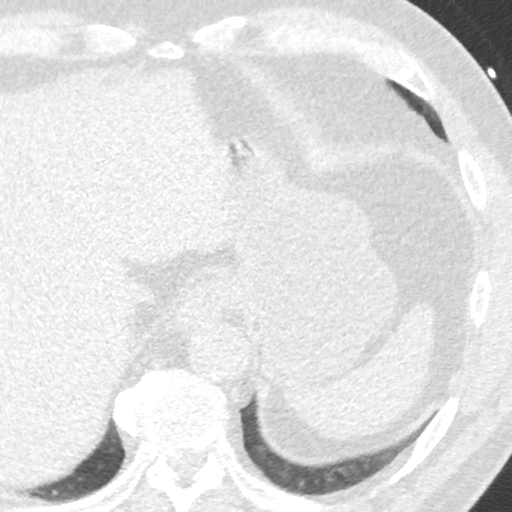
[im 18/44  vessel]
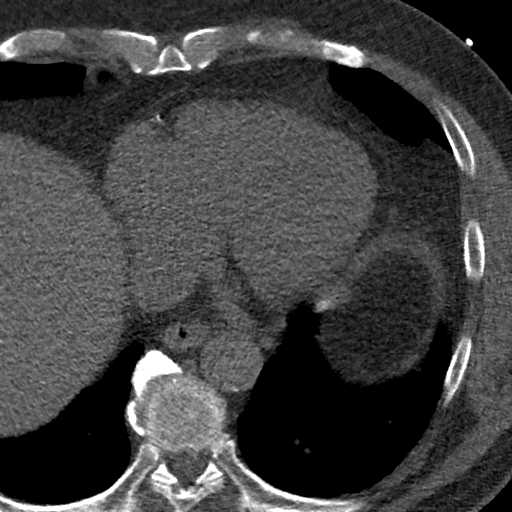
[im 26/44  vessel]
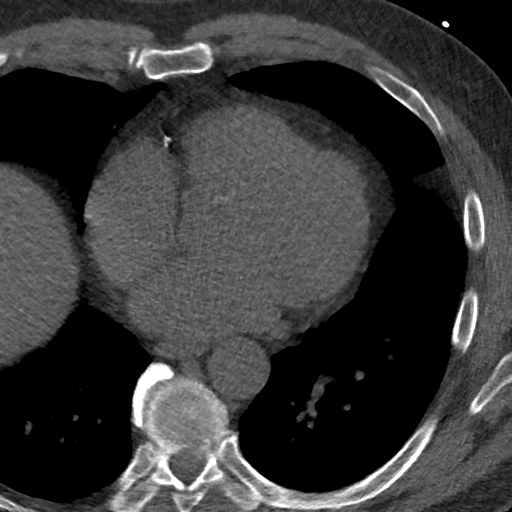
[im 35/44  vessel]
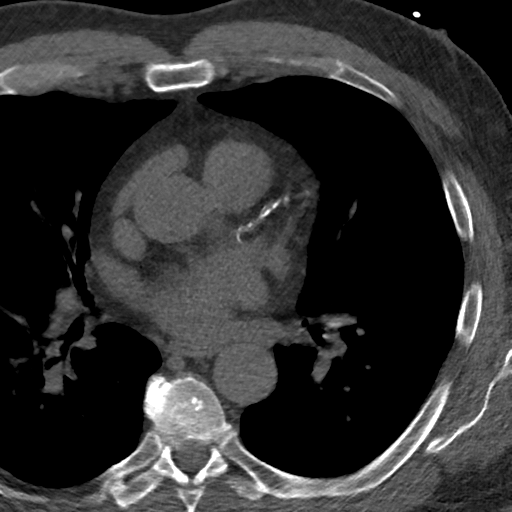

[Series 3: lung 35 % · axial · 0.76mm/px · z∈[-334,-214]mm · 5 of 60 slices shown]
[im 10/60  lung]
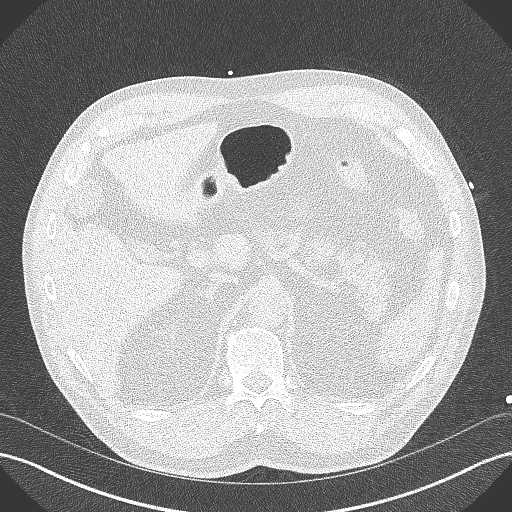
[im 20/60  lung]
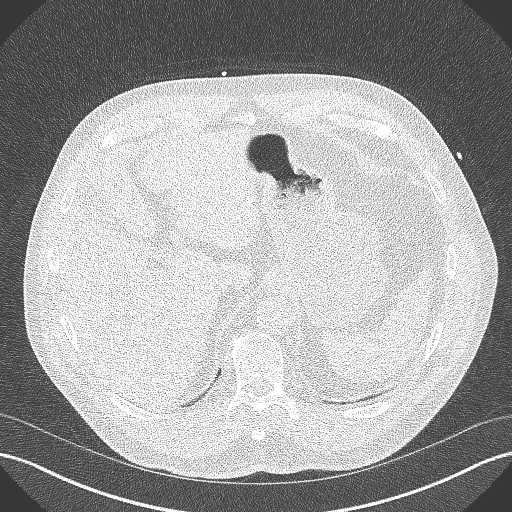
[im 30/60  lung]
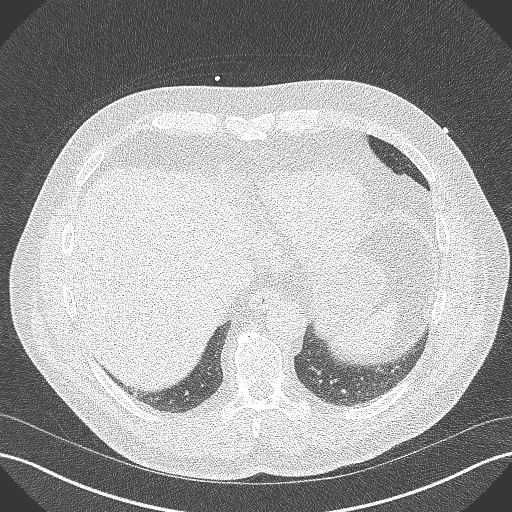
[im 40/60  lung]
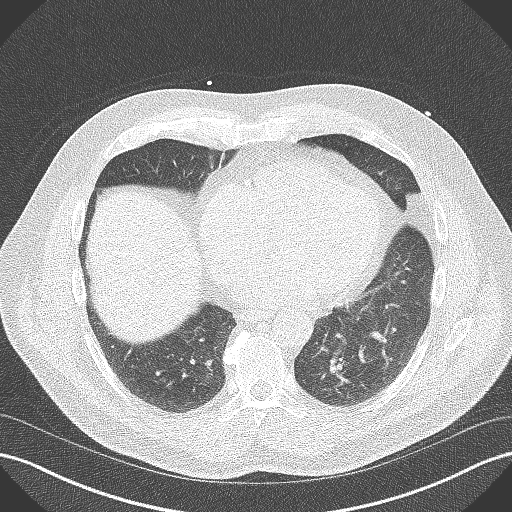
[im 50/60  lung]
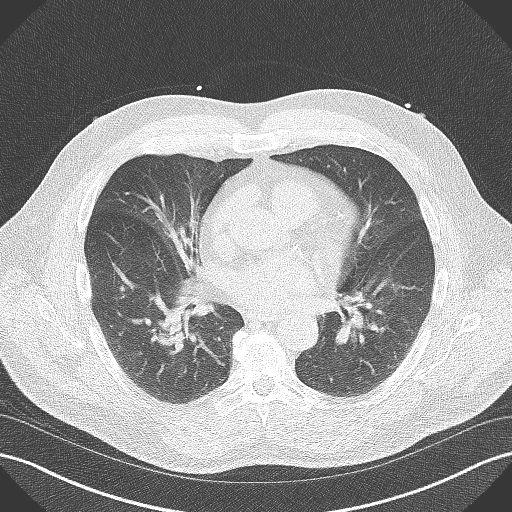

[Series 4: lung st 35 % · axial · 0.76mm/px · z∈[-334,-214]mm · 5 of 60 slices shown]
[im 10/60  lung]
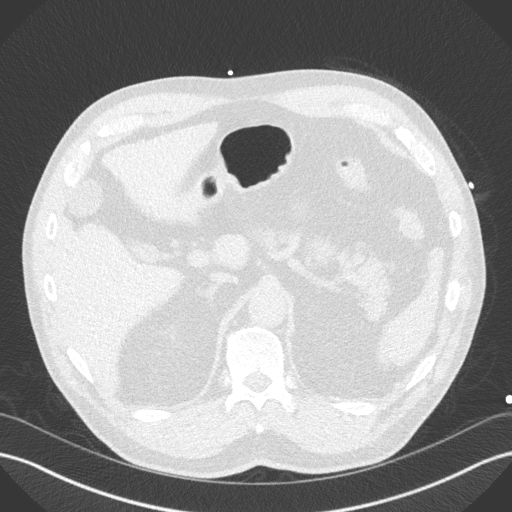
[im 20/60  lung]
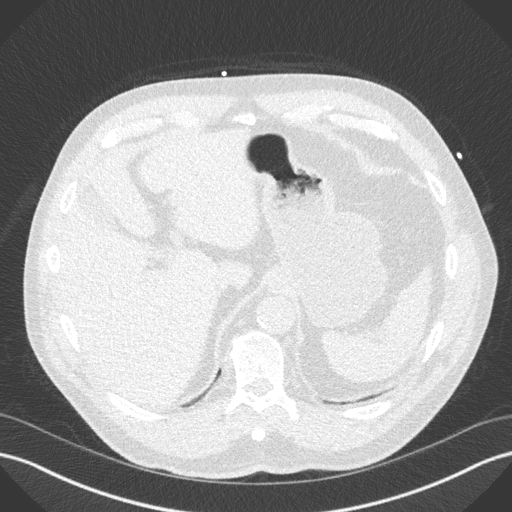
[im 30/60  lung]
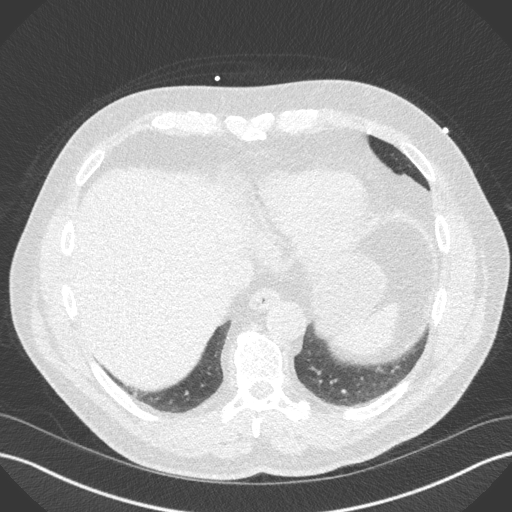
[im 40/60  lung]
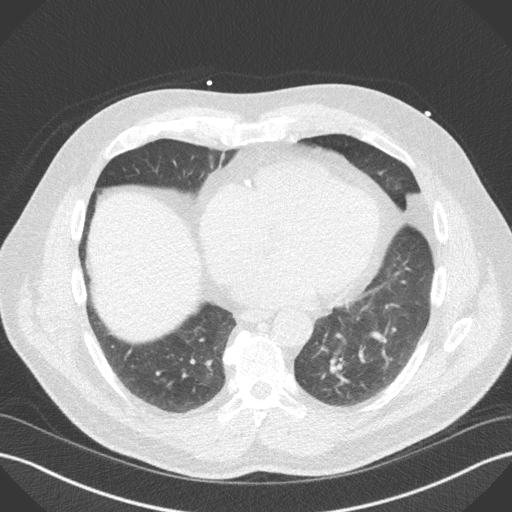
[im 50/60  lung]
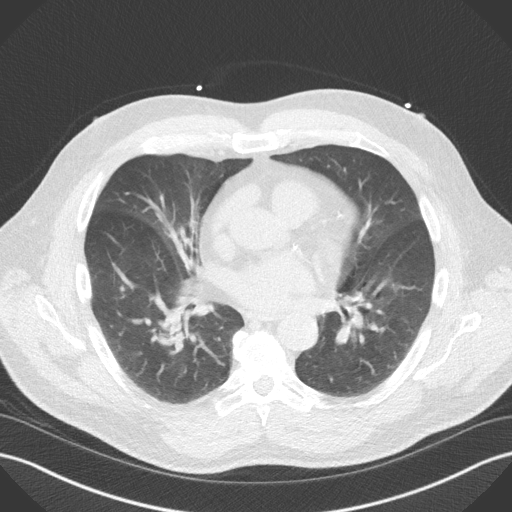

[14 of 20 positions shown; findings below may reference images not displayed]

FINDINGS: Atherosclerotic calcifications in the thoracic aorta within the
visualized portions of the thorax there are no suspicious appearing
pulmonary nodules or masses, there is no acute consolidative
airspace disease, no pleural effusions, no pneumothorax and no
lymphadenopathy. Visualized portions of the upper abdomen
demonstrates an incompletely imaged low-attenuation lesion in the
upper left retroperitoneum, incompletely characterized on today's
non-contrast CT examination, but similar to prior chest CTA
10/14/2017, likely to represent an exophytic renal cyst. There are
no aggressive appearing lytic or blastic lesions noted in the
visualized portions of the skeleton.
IMPRESSION: 1.  Aortic Atherosclerosis (68AIS-NCJ.J).
FINDINGS: Coronary arteries: Normal origins.

Coronary Calcium Score:

Left main: 0

Left anterior descending artery: 152

Left circumflex artery: 152

Right coronary artery: 177

Total: 480

Percentile: 74th

Pericardium: Normal.

Ascending Aorta: Normal caliber.

Non-cardiac: See separate report from [REDACTED].
IMPRESSION: Coronary calcium score of 480. This was 74th percentile for age-,
race-, and sex-matched controls.



If CAC=0, it is reasonable to withhold statin therapy and reassess
in 5 to 10 years, as long as higher risk conditions are absent
(diabetes mellitus, family history of premature CHD in first degree
relatives (males <55 years; females <65 years), cigarette smoking,
or LDL >=190 mg/dL).

If CAC is 1 to 99, it is reasonable to initiate statin therapy for
patients >=55 years of age.

If CAC is >=100 or >=75th percentile, it is reasonable to initiate
statin therapy at any age.

Cardiology referral should be considered for patients with CAC
scores >=400 or >=75th percentile.

*7978 AHA/ACC/AACVPR/AAPA/ABC/CRITOFER/AKIRA/EL COMPA/[place]/RID/DON LOLITO/ABORAD
Guideline on the Management of Blood Cholesterol: A Report of the
American College of Cardiology/American Heart Association Task Force
on Clinical Practice Guidelines. J Am Coll Cardiol.
2368;73(24):2486-2182.

*** End of Addendum ***
EXAM:
OVER-READ INTERPRETATION  CT CHEST

The following report is an over-read performed by radiologist Dr.
Promocja Hazuka [REDACTED] on 11/07/2020. This
over-read does not include interpretation of cardiac or coronary
anatomy or pathology. The coronary calcium score interpretation by
the cardiologist is attached.
FINDINGS: Atherosclerotic calcifications in the thoracic aorta within the
visualized portions of the thorax there are no suspicious appearing
pulmonary nodules or masses, there is no acute consolidative
airspace disease, no pleural effusions, no pneumothorax and no
lymphadenopathy. Visualized portions of the upper abdomen
demonstrates an incompletely imaged low-attenuation lesion in the
upper left retroperitoneum, incompletely characterized on today's
non-contrast CT examination, but similar to prior chest CTA
10/14/2017, likely to represent an exophytic renal cyst. There are
no aggressive appearing lytic or blastic lesions noted in the
visualized portions of the skeleton.
IMPRESSION: 1.  Aortic Atherosclerosis (68AIS-NCJ.J).

## 2023-01-30 NOTE — Patient Instructions (Addendum)
[name] , Thank you for taking time to come for your Medicare Wellness Visit. I appreciate your ongoing commitment to your health goals. Please review the following plan we discussed and let me know if I can assist you in the future.   These are the goals we discussed:  Goals       DIET - REDUCE FAST FOOD INTAKE      Stay Healthy (pt-stated)      Live as long as I can.        This is a list of the screening recommended for you and due dates:  Health Maintenance  Topic Date Due   Colon Cancer Screening  01/30/2024*   Flu Shot  02/13/2023   Medicare Annual Wellness Visit  01/30/2024   DTaP/Tdap/Td vaccine (2 - Td or Tdap) 11/16/2031   Pneumonia Vaccine  Completed   Hepatitis C Screening  Completed   Zoster (Shingles) Vaccine  Completed   HPV Vaccine  Aged Out   COVID-19 Vaccine  Discontinued  *Topic was postponed. The date shown is not the original due date.    Advanced directives: Advance directive discussed with you today. Even though you declined this today, please call our office should you change your mind, and we can give you the proper paperwork for you to fill out.   Conditions/risks identified: None  Next appointment: Follow up in one year for your annual wellness visit.    Preventive Care 72 Years and Older, Male  Preventive care refers to lifestyle choices and visits with your health care provider that can promote health and wellness. What does preventive care include? 72 yearly physical exam. This is also called an annual well check. Dental exams once or twice a year. Routine eye exams. Ask your health care provider how often you should have your eyes checked. Personal lifestyle choices, including: Daily care of your teeth and gums. Regular physical activity. Eating a healthy diet. Avoiding tobacco and drug use. Limiting alcohol use. Practicing safe sex. Taking low doses of aspirin every day. Taking vitamin and mineral supplements as recommended by your  health care provider. What happens during an annual well check? The services and screenings done by your health care provider during your annual well check will depend on your age, overall health, lifestyle risk factors, and family history of disease. Counseling  Your health care provider may ask you questions about your: Alcohol use. Tobacco use. Drug use. Emotional well-being. Home and relationship well-being. Sexual activity. Eating habits. History of falls. Memory and ability to understand (cognition). Work and work Astronomer. Screening  You may have the following tests or measurements: Height, weight, and BMI. Blood pressure. Lipid and cholesterol levels. These may be checked every 5 years, or more frequently if you are over 61 years old. Skin check. Lung cancer screening. You may have this screening every year starting at age 72 if you have a 30-pack-year history of smoking and currently smoke or have quit within the past 15 years. Fecal occult blood test (FOBT) of the stool. You may have this test every year starting at age 72. Flexible sigmoidoscopy or colonoscopy. You may have a sigmoidoscopy every 5 years or a colonoscopy every 10 years starting at age 72. Prostate cancer screening. Recommendations will vary depending on your family history and other risks. Hepatitis C blood test. Hepatitis B blood test. Sexually transmitted disease (STD) testing. Diabetes screening. This is done by checking your blood sugar (glucose) after you have not eaten for a  while (fasting). You may have this done every 1-3 years. Abdominal aortic aneurysm (AAA) screening. You may need this if you are a current or former smoker. Osteoporosis. You may be screened starting at age 72 if you are at high risk. Talk with your health care provider about your test results, treatment options, and if necessary, the need for more tests. Vaccines  Your health care provider may recommend certain vaccines, such  as: Influenza vaccine. This is recommended every year. Tetanus, diphtheria, and acellular pertussis (Tdap, Td) vaccine. You may need a Td booster every 10 years. Zoster vaccine. You may need this after age 72. Pneumococcal 13-valent conjugate (PCV13) vaccine. One dose is recommended after age 72. Pneumococcal polysaccharide (PPSV23) vaccine. One dose is recommended after age 72. Talk to your health care provider about which screenings and vaccines you need and how often you need them. This information is not intended to replace advice given to you by your health care provider. Make sure you discuss any questions you have with your health care provider. Document Released: 07/28/2015 Document Revised: 03/20/2016 Document Reviewed: 05/02/2015 Elsevier Interactive Patient Education  2017 [employer].  Fall Prevention in the Home Falls can cause injuries. They can happen to people of all ages. There are many things you can do to make your home safe and to help prevent falls. What can I do on the outside of my home? Regularly fix the edges of walkways and driveways and fix any cracks. Remove anything that might make you trip as you walk through a door, such as a raised step or threshold. Trim any bushes or trees on the path to your home. Use bright outdoor lighting. Clear any walking paths of anything that might make someone trip, such as rocks or tools. Regularly check to see if handrails are loose or broken. Make sure that both sides of any steps have handrails. Any raised decks and porches should have guardrails on the edges. Have any leaves, snow, or ice cleared regularly. Use sand or salt on walking paths during winter. Clean up any spills in your garage right away. This includes oil or grease spills. What can I do in the bathroom? Use night lights. Install grab bars by the toilet and in the tub and shower. Do not use towel bars as grab bars. Use non-skid mats or decals in the tub or  shower. If you need to sit down in the shower, use a plastic, non-slip stool. Keep the floor dry. Clean up any water that spills on the floor as soon as it happens. Remove soap buildup in the tub or shower regularly. Attach bath mats securely with double-sided non-slip rug tape. Do not have throw rugs and other things on the floor that can make you trip. What can I do in the bedroom? Use night lights. Make sure that you have a light by your bed that is easy to reach. Do not use any sheets or blankets that are too big for your bed. They should not hang down onto the floor. Have a firm chair that has side arms. You can use this for support while you get dressed. Do not have throw rugs and other things on the floor that can make you trip. What can I do in the kitchen? Clean up any spills right away. Avoid walking on wet floors. Keep items that you use a lot in easy-to-reach places. If you need to reach something above you, use a strong step stool that has a grab  bar. Keep electrical cords out of the way. Do not use floor polish or wax that makes floors slippery. If you must use wax, use non-skid floor wax. Do not have throw rugs and other things on the floor that can make you trip. What can I do with my stairs? Do not leave any items on the stairs. Make sure that there are handrails on both sides of the stairs and use them. Fix handrails that are broken or loose. Make sure that handrails are as long as the stairways. Check any carpeting to make sure that it is firmly attached to the stairs. Fix any carpet that is loose or worn. Avoid having throw rugs at the top or bottom of the stairs. If you do have throw rugs, attach them to the floor with carpet tape. Make sure that you have a light switch at the top of the stairs and the bottom of the stairs. If you do not have them, ask someone to add them for you. What else can I do to help prevent falls? Wear shoes that: Do not have high heels. Have  rubber bottoms. Are comfortable and fit you well. Are closed at the toe. Do not wear sandals. If you use a stepladder: Make sure that it is fully opened. Do not climb a closed stepladder. Make sure that both sides of the stepladder are locked into place. Ask someone to hold it for you, if possible. Clearly mark and make sure that you can see: Any grab bars or handrails. First and last steps. Where the edge of each step is. Use tools that help you move around (mobility aids) if they are needed. These include: Canes. Walkers. Scooters. Crutches. Turn on the lights when you go into a dark area. Replace any light bulbs as soon as they burn out. Set up your furniture so you have a clear path. Avoid moving your furniture around. If any of your floors are uneven, fix them. If there are any pets around you, be aware of where they are. Review your medicines with your doctor. Some medicines can make you feel dizzy. This can increase your chance of falling. Ask your doctor what other things that you can do to help prevent falls. This information is not intended to replace advice given to you by your health care provider. Make sure you discuss any questions you have with your health care provider. Document Released: 04/27/2009 Document Revised: 12/07/2015 Document Reviewed: 08/05/2014 Elsevier Interactive Patient Education  2017 [employer].

## 2023-01-30 NOTE — Progress Notes (Signed)
Subjective:   [name] is a 72 y.o. male who presents for Medicare Annual/Subsequent preventive examination.  Visit Complete: Virtual  I connected with  [name] on 01/30/23 by a audio enabled telemedicine application and verified that I am speaking with the correct person using two identifiers.  Patient Location: Home  Provider Location: Home Office  I discussed the limitations of evaluation and management by telemedicine. The patient expressed understanding and agreed to proceed.  Patient Medicare AWV questionnaire was completed by the patient on ; I have confirmed that all information answered by patient is correct and no changes since this date.  Review of Systems     Cardiac Risk Factors include: advanced age (>50men, >77 women)     Objective:    Today's Vitals   01/30/23 0906  Weight: 224 lb (101.6 kg)  Height: 6\' 1"  (1.854 m)  PainSc: 0-No pain   Body mass index is 29.55 kg/m.     01/30/2023    9:12 AM 01/18/2022    9:06 AM 06/26/2021    1:17 PM 06/23/2020    8:18 AM 06/10/2012    3:06 PM 06/10/2012    8:41 AM 06/05/2012    8:59 AM  Advanced Directives  Does Patient Have a Medical Advance Directive? No No No No Patient does not have advance directive;Patient would not like information  Patient does not have advance directive  Would patient like information on creating a medical advance directive? No - Patient declined  Yes (MAU/Ambulatory/Procedural Areas - Information given)      Pre-existing out of facility DNR order (yellow form or pink MOST form)     No No No    Current Medications (verified) Outpatient Encounter Medications as of 01/30/2023  Medication Sig   albuterol (VENTOLIN HFA) 108 (90 Base) MCG/ACT inhaler SMARTSIG:1 Inhalation Via Inhaler Every 4 Hours   atorvastatin (LIPITOR) 40 MG tablet Take 1 tablet (40 mg total) by mouth daily.   methocarbamol (ROBAXIN) 500 MG tablet Take 1 tablet 3 times a day by oral route as needed  for 30 days.   sildenafil (VIAGRA) 100 MG tablet Take 1 tablet (100 mg total) by mouth daily as needed for erectile dysfunction.   testosterone cypionate (DEPOTESTOSTERONE CYPIONATE) 200 MG/ML injection Inject 1 mL (200 mg total) into the muscle every 21 ( twenty-one) days.   zolpidem (AMBIEN) 10 MG tablet Take 1 tablet (10 mg total) by mouth at bedtime as needed for sleep.   No facility-administered encounter medications on file as of 01/30/2023.    Allergies (verified) Gramineae pollens, Meloxicam, Other, Penicillins, and Tizanidine   History: Past Medical History:  Diagnosis Date   Asthma    Coronavirus infection 07/15/2019   Formatting of this note might be different from the original. Notification of POS result Isolation since day of testing of patient and family members Local Health Dept will call with specific instructions Conv Care their POC until the [facility] contacts them Obtaining testing for symptomatic Family Members Discuss self-isolation measures Discuss in-home separation and safety measures Discuss web-based C   Insomnia    Kidney stones 13 yrs ago   Mixed hyperlipidemia    Other emphysema (HCC)    PONV (postoperative nausea and vomiting)    Primary insomnia    Testicular hypofunction    Past Surgical History:  Procedure Laterality Date   bilateral rotator cuff repair  right 2004, left 2009   left ankle fx x 2     lithotripsy  Right    SHOULDER ARTHROSCOPY WITH OPEN ROTATOR CUFF REPAIR AND DISTAL CLAVICLE ACROMINECTOMY  06/10/2012   Procedure: SHOULDER ARTHROSCOPY WITH OPEN ROTATOR CUFF REPAIR AND DISTAL CLAVICLE ACROMINECTOMY;  Surgeon: Drucilla Schmidt, MD;  Location: [facility];  Service: Orthopedics;  Laterality: Right;  LABRAL DEBRIDEMENT OPEN ANTERIOR ACROMINECTOMY/ROTATOR CUFF REPAIR with anchor   Family History  Problem Relation Age of Onset   Lymphoma Brother    Social History   Socioeconomic History   Marital status: Married    Spouse name: [name]   Number of  children: 1   Years of education: 16   Highest education level: Master's degree (e.g., MA, MS, MEng, MEd, MSW, MBA)  Occupational History    Comment: retired Paramedic works  Tobacco Use   Smoking status: Former    Current packs/day: 1.00    Average packs/day: 1 pack/day for 2.0 years (2.0 ttl pk-yrs)    Types: Cigarettes   Smokeless tobacco: Former    Types: Chew, Snuff   Tobacco comments:    quit smoking 40 yrs ago  Vaping Use   Vaping status: Never Used  Substance and Sexual Activity   Alcohol use: Yes    Comment: occasionally   Drug use: Yes    Types: Marijuana   Sexual activity: Yes  Other Topics Concern   Not on file  Social History Narrative   Not on file   Social Determinants of Health   Financial Resource Strain: Low Risk  (01/30/2023)   Overall Financial Resource Strain (CARDIA)    Difficulty of Paying Living Expenses: Not hard at all  Food Insecurity: No Food Insecurity (01/30/2023)   Hunger Vital Sign    Worried About Running Out of Food in the Last Year: Never true    Ran Out of Food in the Last Year: Never true  Transportation Needs: No Transportation Needs (01/30/2023)   PRAPARE - Administrator, Civil Service (Medical): No    Lack of Transportation (Non-Medical): No  Physical Activity: Sufficiently Active (01/30/2023)   Exercise Vital Sign    Days of Exercise per Week: 7 days    Minutes of Exercise per Session: 30 min  Stress: No Stress Concern Present (01/30/2023)   [employer] of Occupational Health - Occupational Stress Questionnaire    Feeling of Stress : Not at all  Social Connections: Socially Integrated (01/30/2023)   Social Connection and Isolation Panel [NHANES]    Frequency of Communication with Friends and Family: More than three times a week    Frequency of Social Gatherings with Friends and Family: More than three times a week    Attends Religious Services: More than 4 times per year    Active Member of [employer] or Organizations:  Yes    Attends Engineer, structural: More than 4 times per year    Marital Status: Married    Tobacco Counseling Counseling given: Not Answered Tobacco comments: quit smoking 40 yrs ago   Clinical Intake:  Pre-visit preparation completed: No  Pain : No/denies pain Pain Score: 0-No pain     BMI - recorded: 29.55 Nutritional Status: BMI 25 -29 Overweight Nutritional Risks: None Diabetes: No  How often do you need to have someone help you when you read instructions, pamphlets, or other written materials from your doctor or pharmacy?: 1 - Never  Interpreter Needed?: No  Information entered by :: Theresa Mulligan LPN   Activities of Daily Living    01/30/2023    9:11 AM  In your present state of health, do you have any difficulty performing the following activities:  Hearing? 0  Vision? 0  Difficulty concentrating or making decisions? 0  Walking or climbing stairs? 0  Dressing or bathing? 0  Doing errands, shopping? 0  Preparing Food and eating ? N  Using the Toilet? N  In the past six months, have you accidently leaked urine? N  Do you have problems with loss of bowel control? N  Managing your Medications? N  Managing your Finances? N  Housekeeping or managing your Housekeeping? N    Patient Care Team: Langley Gauss, [place] as PCP - General (Physician Assistant) Lucie Leather Alvira Philips, MD as Consulting Physician (Allergy and Immunology)  Indicate any recent Medical Services you may have received from other than [facility] providers in the past year (date may be approximate).     Assessment:   This is a routine wellness examination for [facility].  Hearing/Vision screen Hearing Screening - Comments:: Denies hearing difficulties   Vision Screening - Comments:: Wears rx glasses - up to date with routine eye exams with  Dr Juliette Mangle  Dietary issues and exercise activities discussed:     Goals Addressed               This Visit's Progress     Stay Healthy (pt-stated)         Live as long as I can.       Depression Screen    01/30/2023    9:11 AM 01/03/2023    7:34 AM 07/03/2022    7:35 AM 06/26/2021    1:16 PM 12/25/2020    7:38 AM 06/23/2020    8:19 AM 01/13/2020    7:36 AM  PHQ 2/9 Scores  PHQ - 2 Score 0 0 0 0 0 0 0  PHQ- 9 Score   2        Fall Risk    01/30/2023    9:11 AM 01/03/2023    7:34 AM 12/26/2021    8:01 AM 06/26/2021    1:18 PM 12/25/2020    7:38 AM  Fall Risk   Falls in the past year? 0 0 0 0 0  Number falls in past yr: 0 0 0 0 0  Injury with Fall? 0 0 0 0 0  Risk for fall due to : No Fall Risks No Fall Risks No Fall Risks No Fall Risks   Follow up Falls prevention discussed Falls evaluation completed Falls evaluation completed Falls evaluation completed;Falls prevention discussed     MEDICARE RISK AT HOME:  Medicare Risk at Home - 01/30/23 0916     Any stairs in or around the home? Yes    If so, are there any without handrails? No    Home free of loose throw rugs in walkways, pet beds, electrical cords, etc? Yes    Adequate lighting in your home to reduce risk of falls? Yes    Life alert? No    Use of a cane, walker or w/c? No    Grab bars in the bathroom? Yes    Shower chair or bench in shower? Yes    Elevated toilet seat or a handicapped toilet? Yes             TIMED UP AND GO:  Was the test performed?  No    Cognitive Function:        01/30/2023    9:12 AM 06/26/2021    1:29 PM 06/23/2020    8:22  AM  6CIT Screen  What Year? 0 points 0 points 0 points  What month? 0 points 0 points 0 points  What time? 0 points 0 points 0 points  Count back from 20 0 points 0 points 0 points  Months in reverse 0 points 0 points 0 points  Repeat phrase 0 points 0 points 0 points  Total Score 0 points 0 points 0 points    Immunizations Immunization History  Administered Date(s) Administered   Fluad Quad(high Dose 65+) 05/01/2021, 04/22/2022   Influenza-Unspecified 04/16/2018, 05/18/2019   Moderna Sars-Covid-2  Vaccination 08/27/2019, 09/22/2019, 07/11/2020   Pneumococcal Conjugate-13 06/09/2017   Pneumococcal Polysaccharide-23 12/25/2020   Tdap 11/15/2021   Zoster Recombinant(Shingrix) 11/15/2021, 02/12/2022    TDAP status: Up to date  Flu Vaccine status: Up to date  Pneumococcal vaccine status: Up to date   Qualifies for Shingles Vaccine? Yes   Zostavax completed Yes   Shingrix Completed?: Yes  Screening Tests Health Maintenance  Topic Date Due   Colonoscopy  01/30/2024 (Originally 09/26/2022)   INFLUENZA VACCINE  02/13/2023   Medicare Annual Wellness (AWV)  01/30/2024   DTaP/Tdap/Td (2 - Td or Tdap) 11/16/2031   Pneumonia Vaccine 63+ Years old  Completed   Hepatitis C Screening  Completed   Zoster Vaccines- Shingrix  Completed   HPV VACCINES  Aged Out   COVID-19 Vaccine  Discontinued    Health Maintenance  There are no preventive care reminders to display for this patient.   Colorectal cancer screening: Referral to GI placed Patient deferred. Pt aware the office will call re: appt.  Lung Cancer Screening: (Low Dose CT Chest recommended if Age 58-80 years, 20 pack-year currently smoking OR have quit w/in 15years.) does not qualify.    Additional Screening:  Hepatitis C Screening: does qualify; Completed 01/03/23  Vision Screening: Recommended annual ophthalmology exams for early detection of glaucoma and other disorders of the eye. Is the patient up to date with their annual eye exam?  Yes  Who is the provider or what is the name of the office in which the patient attends annual eye exams? Dr Juliette Mangle If pt is not established with a provider, would they like to be referred to a provider to establish care? No .   Dental Screening: Recommended annual dental exams for proper oral hygiene   Community Resource Referral / Chronic Care Management:  CRR required this visit?  No   CCM required this visit?  No     Plan:     I have personally reviewed and noted the  following in the patient's chart:   Medical and social history Use of alcohol, tobacco or illicit drugs  Current medications and supplements including opioid prescriptions. Patient is not currently taking opioid prescriptions. Functional ability and status Nutritional status Physical activity Advanced directives List of other physicians Hospitalizations, surgeries, and ER visits in previous 12 months Vitals Screenings to include cognitive, depression, and falls Referrals and appointments  In addition, I have reviewed and discussed with patient certain preventive protocols, quality metrics, and best practice recommendations. A written personalized care plan for preventive services as well as general preventive health recommendations were provided to patient.     Tillie Rung, LPN   01/08/349   After Visit Summary: (Mail) Due to this being a telephonic visit, the after visit summary with patients personalized plan was offered to patient via mail   Nurse Notes: None

## 2023-02-18 DIAGNOSIS — L821 Other seborrheic keratosis: Secondary | ICD-10-CM | POA: Diagnosis not present

## 2023-02-18 DIAGNOSIS — L820 Inflamed seborrheic keratosis: Secondary | ICD-10-CM | POA: Diagnosis not present

## 2023-02-18 DIAGNOSIS — D2239 Melanocytic nevi of other parts of face: Secondary | ICD-10-CM | POA: Diagnosis not present

## 2023-02-18 DIAGNOSIS — D225 Melanocytic nevi of trunk: Secondary | ICD-10-CM | POA: Diagnosis not present

## 2023-02-18 DIAGNOSIS — L814 Other melanin hyperpigmentation: Secondary | ICD-10-CM | POA: Diagnosis not present

## 2023-03-25 DIAGNOSIS — M5416 Radiculopathy, lumbar region: Secondary | ICD-10-CM | POA: Diagnosis not present

## 2023-04-07 ENCOUNTER — Other Ambulatory Visit: Payer: Self-pay | Admitting: Family Medicine

## 2023-05-13 ENCOUNTER — Ambulatory Visit (INDEPENDENT_AMBULATORY_CARE_PROVIDER_SITE_OTHER): Payer: Medicare HMO

## 2023-05-13 DIAGNOSIS — Z23 Encounter for immunization: Secondary | ICD-10-CM | POA: Diagnosis not present

## 2023-06-05 ENCOUNTER — Ambulatory Visit: Payer: Medicare HMO | Admitting: Podiatry

## 2023-06-05 ENCOUNTER — Ambulatory Visit: Payer: Medicare HMO

## 2023-06-05 DIAGNOSIS — L84 Corns and callosities: Secondary | ICD-10-CM | POA: Diagnosis not present

## 2023-06-05 DIAGNOSIS — M79672 Pain in left foot: Secondary | ICD-10-CM | POA: Diagnosis not present

## 2023-06-05 NOTE — Progress Notes (Signed)
Chief Complaint  Patient presents with   Callouses    Callus sub 5th, likes to be active, walks daily, will take a puma stone to it daily after shower, but hasn't done it in a couple day, been there for about 6 months    HPI: 72 y.o. male presents today with concern of a painful corn near the plantar lateral left fifth metatarsal head.  Notes that it is hurting so much it is uncomfortable to walk.  Denies stepping on foreign object or getting new shoes recently.  He states it is been present for at least 6 months  Past Medical History:  Diagnosis Date   Asthma    Coronavirus infection 07/15/2019   Formatting of this note might be different from the original. Notification of POS result Isolation since day of testing of patient and family members Local Health Dept will call with specific instructions Conv Care their POC until the [facility] contacts them Obtaining testing for symptomatic Family Members Discuss self-isolation measures Discuss in-home separation and safety measures Discuss web-based C   Insomnia    Kidney stones 13 yrs ago   Mixed hyperlipidemia    Other emphysema (HCC)    PONV (postoperative nausea and vomiting)    Primary insomnia    Testicular hypofunction     Past Surgical History:  Procedure Laterality Date   bilateral rotator cuff repair  right 2004, left 2009   left ankle fx x 2     lithotripsy Right    SHOULDER ARTHROSCOPY WITH OPEN ROTATOR CUFF REPAIR AND DISTAL CLAVICLE ACROMINECTOMY  06/10/2012   Procedure: SHOULDER ARTHROSCOPY WITH OPEN ROTATOR CUFF REPAIR AND DISTAL CLAVICLE ACROMINECTOMY;  Surgeon: Drucilla Schmidt, MD;  Location: [facility];  Service: Orthopedics;  Laterality: Right;  LABRAL DEBRIDEMENT OPEN ANTERIOR ACROMINECTOMY/ROTATOR CUFF REPAIR with anchor    Allergies  Allergen Reactions   Gramineae Pollens    Meloxicam Other (See Comments)   Other Other (See Comments)   Penicillins Other (See Comments)    Unknown reaction,   Tizanidine Other (See  Comments)   Physical Exam: Palpable pedal pulses left foot.  There is a focal hyperkeratotic lesion on the plantar lateral aspect of the left foot, just proximal to the fifth metatarsal head.  No surrounding erythema or drainage is noted.  No edema is seen.  There is pain on palpation of the lesion.  Assessment/Plan of Care: 1. Corns   2. Left foot pain     Discussed clinical findings with patient today.  The painful hyperkeratotic lesion was shaved with a sterile #313 blade.  Salinocaine and a Band-Aid were applied.  Patient encouraged to evaluate the interior of his shoes at home to make sure there is not a prominent seen which might be rubbing against the area.  He was informed that this is just behind the prominent metatarsal head so I do not feel the cause of his corn is bone in nature.  Informed patient that this may recur once or twice and he can call the office to have this shaved and will either apply Salinocaine or Cantharone to the lesion to try to prohibit recurrence.  Follow-up as needed   Clerance Lav, DPM, FACFAS [facility]     [address]  [place], [place] [address]                Office [phone]  Fax [phone]

## 2023-06-15 DIAGNOSIS — U071 COVID-19: Secondary | ICD-10-CM | POA: Diagnosis not present

## 2023-06-15 DIAGNOSIS — R52 Pain, unspecified: Secondary | ICD-10-CM | POA: Diagnosis not present

## 2023-06-15 DIAGNOSIS — R519 Headache, unspecified: Secondary | ICD-10-CM | POA: Diagnosis not present

## 2023-06-15 DIAGNOSIS — R0981 Nasal congestion: Secondary | ICD-10-CM | POA: Diagnosis not present

## 2023-06-15 DIAGNOSIS — R509 Fever, unspecified: Secondary | ICD-10-CM | POA: Diagnosis not present

## 2023-06-15 DIAGNOSIS — R630 Anorexia: Secondary | ICD-10-CM | POA: Diagnosis not present

## 2023-06-15 DIAGNOSIS — R051 Acute cough: Secondary | ICD-10-CM | POA: Diagnosis not present

## 2023-06-15 DIAGNOSIS — Z20822 Contact with and (suspected) exposure to covid-19: Secondary | ICD-10-CM | POA: Diagnosis not present
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
## 2023-08-13 ENCOUNTER — Encounter: Payer: Self-pay | Admitting: Physician Assistant

## 2023-08-13 ENCOUNTER — Ambulatory Visit (INDEPENDENT_AMBULATORY_CARE_PROVIDER_SITE_OTHER): Payer: Medicare HMO | Admitting: Physician Assistant

## 2023-08-13 VITALS — BP 120/64 | HR 69 | Temp 97.6°F | Resp 16 | Ht 73.0 in | Wt 226.0 lb

## 2023-08-13 DIAGNOSIS — Z131 Encounter for screening for diabetes mellitus: Secondary | ICD-10-CM | POA: Diagnosis not present

## 2023-08-13 DIAGNOSIS — M5442 Lumbago with sciatica, left side: Secondary | ICD-10-CM | POA: Diagnosis not present

## 2023-08-13 DIAGNOSIS — M5441 Lumbago with sciatica, right side: Secondary | ICD-10-CM | POA: Diagnosis not present

## 2023-08-13 DIAGNOSIS — F5101 Primary insomnia: Secondary | ICD-10-CM

## 2023-08-13 DIAGNOSIS — L84 Corns and callosities: Secondary | ICD-10-CM

## 2023-08-13 DIAGNOSIS — G8929 Other chronic pain: Secondary | ICD-10-CM

## 2023-08-13 DIAGNOSIS — E782 Mixed hyperlipidemia: Secondary | ICD-10-CM | POA: Diagnosis not present

## 2023-08-13 DIAGNOSIS — E291 Testicular hypofunction: Secondary | ICD-10-CM

## 2023-08-13 DIAGNOSIS — K219 Gastro-esophageal reflux disease without esophagitis: Secondary | ICD-10-CM | POA: Insufficient documentation

## 2023-08-13 MED ORDER — TESTOSTERONE CYPIONATE 200 MG/ML IM SOLN: 200.0000 mg | INTRAMUSCULAR | 2 refills | Status: DC

## 2023-08-13 MED ORDER — ZOLPIDEM TARTRATE 10 MG PO TABS: 10.0000 mg | ORAL_TABLET | Freq: Every evening | ORAL | 1 refills | Status: DC | PRN

## 2023-08-13 MED ORDER — ATORVASTATIN CALCIUM 40 MG PO TABS: 40.0000 mg | ORAL_TABLET | Freq: Every day | ORAL | 0 refills | Status: DC

## 2023-08-13 NOTE — Assessment & Plan Note (Signed)
Current prescription running low. -Refill prescription.

## 2023-08-13 NOTE — Assessment & Plan Note (Signed)
Current prescription for Ambien running low. -Refill prescription.

## 2023-08-13 NOTE — Patient Instructions (Signed)
VISIT SUMMARY:  Today, we discussed your ongoing health concerns, including chronic back pain, foot callus, history of rotator cuff surgeries, past COVID-19 infection, herniated disc, testosterone replacement, hyperlipidemia, and insomnia. We reviewed your current management strategies and made plans for future care. Routine blood work and other health maintenance measures were also addressed.  YOUR PLAN:  -CHRONIC BACK PAIN: Chronic back pain is long-lasting pain in the back, often due to injury or other underlying conditions. Continue with your current management strategies, including Robaxin, stretching, walking, and using a heat pad. Consider another injection in February 2025 if the pain persists.  -FOOT CALLUS: A foot callus is a thickened area of skin that can become painful. Continue wearing thicker socks and monitor for any return of pain.  -ROTATOR CUFF SURGERIES: You have had multiple surgeries on your shoulder. Continue managing with lighter weights and more repetitions during workouts. No current plans for additional surgery.  -COVID-19: You had a COVID-19 infection with a prolonged cough. Monitor for any persistent cough or other respiratory symptoms.  -HERNIATED DISC: A herniated disc occurs when the soft center of a spinal disc pushes through a crack in the tougher exterior casing. Continue with daily walking and stretching as part of your management strategy.  -TESTOSTERONE REPLACEMENT: Your testosterone levels are being managed with medication. Your prescription will be refilled.  -HYPERLIPIDEMIA: Hyperlipidemia is having high levels of fats in the blood, which can increase the risk of heart disease. Your prescription for Lipitor will be refilled.  -INSOMNIA: Insomnia is difficulty falling or staying asleep. Your prescription for Ambien will be refilled.  -GENERAL HEALTH MAINTENANCE: Routine blood work will be ordered, including tests for complete blood count, kidney and liver  function, electrolytes, cholesterol, testosterone levels, and PSA. An A1C test will be added to screen for diabetes. Reschedule your colonoscopy. Follow up in 6 months unless lab results indicate a need for sooner follow-up.  INSTRUCTIONS:  Follow up in 6 months unless lab results indicate a need for sooner follow-up. Consider another back injection in February 2025 if pain persists. Reschedule your colonoscopy.
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
## 2023-08-13 NOTE — Assessment & Plan Note (Signed)
Controlled Continue to monitor symptoms Continue to manage with diet and exercise Will adjust treatment depending on symptoms

## 2023-08-13 NOTE — Assessment & Plan Note (Addendum)
History of painful callus on the side of the foot, managed by podiatry with shaving and application of a skin peeling agent. Currently not causing pain, but patient feels it may be returning. -Continue wearing thicker socks and monitor for return of pain.  Herniated Disc Managed with prednisone, chiropractic care, physical therapy, and injections. Current management includes daily walking and stretching. -Continue current management strategies including daily walking and stretching.

## 2023-08-13 NOTE — Assessment & Plan Note (Signed)
Labs drawn today Continue to manage with diet and exercise Will adjust treatment depending on labs

## 2023-08-13 NOTE — Assessment & Plan Note (Signed)
Well controlled.  Continue to work on eating a healthy diet and exercise.  Labs drawn today.   No major side effects reported, and no issues with compliance. The current medical regimen is effective;  continue present plan with Lipitor 40mg  Will adjust medication as needed depending on labs Lab Results  Component Value Date   LDLCALC 69 01/03/2023

## 2023-08-15 LAB — CBC WITH DIFFERENTIAL/PLATELET
Basophils Absolute: 0.1 10*3/uL (ref 0.0–0.2)
Basos: 1 %
EOS (ABSOLUTE): 0.3 10*3/uL (ref 0.0–0.4)
Eos: 3 %
Hematocrit: 51.3 % — ABNORMAL HIGH (ref 37.5–51.0)
Hemoglobin: 17 g/dL (ref 13.0–17.7)
Immature Grans (Abs): 0.2 10*3/uL — ABNORMAL HIGH (ref 0.0–0.1)
Immature Granulocytes: 3 %
Lymphocytes Absolute: 2.5 10*3/uL (ref 0.7–3.1)
Lymphs: 31 %
MCH: 30.6 pg (ref 26.6–33.0)
MCHC: 33.1 g/dL (ref 31.5–35.7)
MCV: 92 fL (ref 79–97)
Monocytes Absolute: 0.9 10*3/uL (ref 0.1–0.9)
Monocytes: 11 %
Neutrophils Absolute: 4.3 10*3/uL (ref 1.4–7.0)
Neutrophils: 51 %
Platelets: 316 10*3/uL (ref 150–450)
RBC: 5.56 x10E6/uL (ref 4.14–5.80)
RDW: 13.1 % (ref 11.6–15.4)
WBC: 8.3 10*3/uL (ref 3.4–10.8)

## 2023-08-15 LAB — LIPID PANEL
Chol/HDL Ratio: 2.3 {ratio} (ref 0.0–5.0)
Cholesterol, Total: 113 mg/dL (ref 100–199)
HDL: 50 mg/dL (ref >39)
LDL Chol Calc (NIH): 49 mg/dL (ref 0–99)
Triglycerides: 65 mg/dL (ref 0–149)
VLDL Cholesterol Cal: 14 mg/dL (ref 5–40)

## 2023-08-15 LAB — COMPREHENSIVE METABOLIC PANEL
ALT: 29 [IU]/L (ref 0–44)
AST: 31 [IU]/L (ref 0–40)
Albumin: 4.5 g/dL (ref 3.8–4.8)
Alkaline Phosphatase: 126 [IU]/L — ABNORMAL HIGH (ref 44–121)
BUN/Creatinine Ratio: 13 (ref 10–24)
BUN: 15 mg/dL (ref 8–27)
Bilirubin Total: 0.6 mg/dL (ref 0.0–1.2)
CO2: 22 mmol/L (ref 20–29)
Calcium: 9.4 mg/dL (ref 8.6–10.2)
Chloride: 99 mmol/L (ref 96–106)
Creatinine, Ser: 1.17 mg/dL (ref 0.76–1.27)
Globulin, Total: 2.5 g/dL (ref 1.5–4.5)
Glucose: 84 mg/dL (ref 70–99)
Potassium: 5.1 mmol/L (ref 3.5–5.2)
Sodium: 138 mmol/L (ref 134–144)
Total Protein: 7 g/dL (ref 6.0–8.5)
eGFR: 66 mL/min/{1.73_m2} (ref >59)

## 2023-08-15 LAB — TESTOSTERONE,FREE AND TOTAL
Testosterone, Free: 1.6 pg/mL — ABNORMAL LOW (ref 6.6–18.1)
Testosterone: 186 ng/dL — ABNORMAL LOW (ref 264–916)

## 2023-08-15 LAB — HEMOGLOBIN A1C
Est. average glucose Bld gHb Est-mCnc: 120 mg/dL
Hgb A1c MFr Bld: 5.8 % — ABNORMAL HIGH (ref 4.8–5.6)

## 2023-08-15 LAB — PSA: Prostate Specific Ag, Serum: 3.3 ng/mL (ref 0.0–4.0)

## 2023-08-19 ENCOUNTER — Telehealth: Payer: Self-pay

## 2023-08-19 NOTE — Telephone Encounter (Signed)
Called patient made him aware, he can come by office and get copy of labs

## 2023-08-19 NOTE — Telephone Encounter (Signed)
 Copied from CRM [id number]. Topic: Clinical - Lab/Test Results >> Aug 18, 2023  3:08 PM Deleta HERO wrote: Reason for CRM: PT returned missed call for lab results, relayed the results verbatim as requested. Patient is wanting to know if he can stop by the office to pick up a copy of his results tomorrow if possible.

## 2023-08-21 DIAGNOSIS — L578 Other skin changes due to chronic exposure to nonionizing radiation: Secondary | ICD-10-CM | POA: Diagnosis not present

## 2023-08-21 DIAGNOSIS — L821 Other seborrheic keratosis: Secondary | ICD-10-CM | POA: Diagnosis not present

## 2023-08-21 DIAGNOSIS — D2239 Melanocytic nevi of other parts of face: Secondary | ICD-10-CM | POA: Diagnosis not present

## 2023-08-21 DIAGNOSIS — D225 Melanocytic nevi of trunk: Secondary | ICD-10-CM | POA: Diagnosis not present

## 2023-08-21 DIAGNOSIS — L570 Actinic keratosis: Secondary | ICD-10-CM | POA: Diagnosis not present

## 2023-08-29 ENCOUNTER — Other Ambulatory Visit: Payer: Self-pay

## 2023-08-29 ENCOUNTER — Telehealth: Payer: Self-pay

## 2023-08-29 MED ORDER — OSELTAMIVIR PHOSPHATE 75 MG PO CAPS: 75.0000 mg | ORAL_CAPSULE | Freq: Two times a day (BID) | ORAL | 0 refills | Status: AC

## 2023-08-29 NOTE — Telephone Encounter (Signed)
Patient called in due to exposure to Flu. Patient states his wife tested positive for flu. He stated he had fever Wednesday night, cough, congestion and overall not feeling well. Tamiflu sent to pharmacy on file per Dr Faylene Kurtz.

## 2023-11-25 DIAGNOSIS — M25562 Pain in left knee: Secondary | ICD-10-CM | POA: Diagnosis not present

## 2023-12-16 DIAGNOSIS — M25562 Pain in left knee: Secondary | ICD-10-CM | POA: Diagnosis not present

## 2023-12-18 ENCOUNTER — Other Ambulatory Visit: Payer: Self-pay | Admitting: Physician Assistant

## 2023-12-18 DIAGNOSIS — E782 Mixed hyperlipidemia: Secondary | ICD-10-CM

## 2023-12-18 MED ORDER — ATORVASTATIN CALCIUM 40 MG PO TABS: 40.0000 mg | ORAL_TABLET | Freq: Every day | ORAL | 0 refills | Status: DC

## 2023-12-18 NOTE — Telephone Encounter (Signed)
 Copied from CRM [id number]. Topic: Clinical - Medication Refill >> Dec 18, 2023  9:23 AM Ary Bitter R wrote: Medication: atorvastatin  (LIPITOR) 40 MG tablet  Has the patient contacted their pharmacy? Yes. They said he needs a new rx.  This is the patient's preferred pharmacy:  [address] [place], [place] - [address] [place] [address] Phone: [phone] Fax: [phone]  Is this the correct pharmacy for this prescription? Yes  Has the prescription been filled recently? Yes  Is the patient out of the medication? No but has 2 tabs left  Has the patient been seen for an appointment in the last year OR does the patient have an upcoming appointment? Yes  Can we respond through MyChart? No  Agent: Please be advised that Rx refills may take up to 3 business days. We ask that you follow-up with your pharmacy.

## 2024-01-22 DIAGNOSIS — M25562 Pain in left knee: Secondary | ICD-10-CM | POA: Diagnosis not present

## 2024-01-28 ENCOUNTER — Other Ambulatory Visit: Payer: Self-pay

## 2024-01-28 DIAGNOSIS — E291 Testicular hypofunction: Secondary | ICD-10-CM

## 2024-01-28 DIAGNOSIS — Z131 Encounter for screening for diabetes mellitus: Secondary | ICD-10-CM

## 2024-01-28 DIAGNOSIS — E782 Mixed hyperlipidemia: Secondary | ICD-10-CM

## 2024-02-03 ENCOUNTER — Ambulatory Visit (INDEPENDENT_AMBULATORY_CARE_PROVIDER_SITE_OTHER): Payer: Medicare HMO

## 2024-02-03 DIAGNOSIS — Z1211 Encounter for screening for malignant neoplasm of colon: Secondary | ICD-10-CM

## 2024-02-03 DIAGNOSIS — Z0000 Encounter for general adult medical examination without abnormal findings: Secondary | ICD-10-CM

## 2024-02-03 NOTE — Patient Instructions (Signed)
 Mr. Bitter , Thank you for taking time to come for your Medicare Wellness Visit. I appreciate your ongoing commitment to your health goals. Please review the following plan we discussed and let me know if I can assist you in the future.     This is a list of the screening recommended for you and due dates:  Health Maintenance  Topic Date Due   Colon Cancer Screening  09/26/2022   Flu Shot  02/13/2024   Medicare Annual Wellness Visit  02/02/2025   DTaP/Tdap/Td vaccine (2 - Td or Tdap) 11/16/2031   Pneumococcal Vaccine for age over 64  Completed   Hepatitis C Screening  Completed   Zoster (Shingles) Vaccine  Completed   Hepatitis B Vaccine  Aged Out   HPV Vaccine  Aged Out   Meningitis B Vaccine  Aged Out   COVID-19 Vaccine  Discontinued    Preventive Care 56 Years and Older, Male  Preventive care refers to lifestyle choices and visits with your health care provider that can promote health and wellness. What does preventive care include? A yearly physical exam. This is also called an annual well check. Dental exams once or twice a year. Routine eye exams. Ask your health care provider how often you should have your eyes checked. Personal lifestyle choices, including: Daily care of your teeth and gums. Regular physical activity. Eating a healthy diet. Avoiding tobacco and drug use. Limiting alcohol use. Practicing safe sex. Taking low doses of aspirin every day. Taking vitamin and mineral supplements as recommended by your health care provider. What happens during an annual well check? The services and screenings done by your health care provider during your annual well check will depend on your age, overall health, lifestyle risk factors, and family history of disease. Counseling  Your health care provider may ask you questions about your: Alcohol use. Tobacco use. Drug use. Emotional well-being. Home and relationship well-being. Sexual activity. Eating habits. History  of falls. Memory and ability to understand (cognition). Work and work Astronomer. Screening  You may have the following tests or measurements: Height, weight, and BMI. Blood pressure. Lipid and cholesterol levels. These may be checked every 5 years, or more frequently if you are over 45 years old. Skin check. Lung cancer screening. You may have this screening every year starting at age 82 if you have a 30-pack-year history of smoking and currently smoke or have quit within the past 15 years. Fecal occult blood test (FOBT) of the stool. You may have this test every year starting at age 51. Flexible sigmoidoscopy or colonoscopy. You may have a sigmoidoscopy every 5 years or a colonoscopy every 10 years starting at age 46. Prostate cancer screening. Recommendations will vary depending on your family history and other risks. Hepatitis C blood test. Hepatitis B blood test. Sexually transmitted disease (STD) testing. Diabetes screening. This is done by checking your blood sugar (glucose) after you have not eaten for a while (fasting). You may have this done every 1-3 years. Abdominal aortic aneurysm (AAA) screening. You may need this if you are a current or former smoker. Osteoporosis. You may be screened starting at age 69 if you are at high risk. Talk with your health care provider about your test results, treatment options, and if necessary, the need for more tests. Vaccines  Your health care provider may recommend certain vaccines, such as: Influenza vaccine. This is recommended every year. Tetanus, diphtheria, and acellular pertussis (Tdap, Td) vaccine. You may need a Td  booster every 10 years. Zoster vaccine. You may need this after age 63. Pneumococcal 13-valent conjugate (PCV13) vaccine. One dose is recommended after age 4. Pneumococcal polysaccharide (PPSV23) vaccine. One dose is recommended after age 41. Talk to your health care provider about which screenings and vaccines you need  and how often you need them. This information is not intended to replace advice given to you by your health care provider. Make sure you discuss any questions you have with your health care provider. Document Released: 07/28/2015 Document Revised: 03/20/2016 Document Reviewed: 05/02/2015 Elsevier Interactive Patient Education  2017 [employer].  Fall Prevention in the Home Falls can cause injuries. They can happen to people of all ages. There are many things you can do to make your home safe and to help prevent falls. What can I do on the outside of my home? Regularly fix the edges of walkways and driveways and fix any cracks. Remove anything that might make you trip as you walk through a door, such as a raised step or threshold. Trim any bushes or trees on the path to your home. Use bright outdoor lighting. Clear any walking paths of anything that might make someone trip, such as rocks or tools. Regularly check to see if handrails are loose or broken. Make sure that both sides of any steps have handrails. Any raised decks and porches should have guardrails on the edges. Have any leaves, snow, or ice cleared regularly. Use sand or salt on walking paths during winter. Clean up any spills in your garage right away. This includes oil or grease spills. What can I do in the bathroom? Use night lights. Install grab bars by the toilet and in the tub and shower. Do not use towel bars as grab bars. Use non-skid mats or decals in the tub or shower. If you need to sit down in the shower, use a plastic, non-slip stool. Keep the floor dry. Clean up any water that spills on the floor as soon as it happens. Remove soap buildup in the tub or shower regularly. Attach bath mats securely with double-sided non-slip rug tape. Do not have throw rugs and other things on the floor that can make you trip. What can I do in the bedroom? Use night lights. Make sure that you have a light by your bed that is easy  to reach. Do not use any sheets or blankets that are too big for your bed. They should not hang down onto the floor. Have a firm chair that has side arms. You can use this for support while you get dressed. Do not have throw rugs and other things on the floor that can make you trip. What can I do in the kitchen? Clean up any spills right away. Avoid walking on wet floors. Keep items that you use a lot in easy-to-reach places. If you need to reach something above you, use a strong step stool that has a grab bar. Keep electrical cords out of the way. Do not use floor polish or wax that makes floors slippery. If you must use wax, use non-skid floor wax. Do not have throw rugs and other things on the floor that can make you trip. What can I do with my stairs? Do not leave any items on the stairs. Make sure that there are handrails on both sides of the stairs and use them. Fix handrails that are broken or loose. Make sure that handrails are as long as the stairways. Check any carpeting  to make sure that it is firmly attached to the stairs. Fix any carpet that is loose or worn. Avoid having throw rugs at the top or bottom of the stairs. If you do have throw rugs, attach them to the floor with carpet tape. Make sure that you have a light switch at the top of the stairs and the bottom of the stairs. If you do not have them, ask someone to add them for you. What else can I do to help prevent falls? Wear shoes that: Do not have high heels. Have rubber bottoms. Are comfortable and fit you well. Are closed at the toe. Do not wear sandals. If you use a stepladder: Make sure that it is fully opened. Do not climb a closed stepladder. Make sure that both sides of the stepladder are locked into place. Ask someone to hold it for you, if possible. Clearly mark and make sure that you can see: Any grab bars or handrails. First and last steps. Where the edge of each step is. Use tools that help you move  around (mobility aids) if they are needed. These include: Canes. Walkers. Scooters. Crutches. Turn on the lights when you go into a dark area. Replace any light bulbs as soon as they burn out. Set up your furniture so you have a clear path. Avoid moving your furniture around. If any of your floors are uneven, fix them. If there are any pets around you, be aware of where they are. Review your medicines with your doctor. Some medicines can make you feel dizzy. This can increase your chance of falling. Ask your doctor what other things that you can do to help prevent falls. This information is not intended to replace advice given to you by your health care provider. Make sure you discuss any questions you have with your health care provider. Document Released: 04/27/2009 Document Revised: 12/07/2015 Document Reviewed: 08/05/2014 Elsevier Interactive Patient Education  2017 [employer].

## 2024-02-03 NOTE — Progress Notes (Signed)
 Subjective:   [name] is a 73 y.o. male who presents for Medicare Annual/Subsequent preventive examination.  This wellness visit is conducted by a nurse.  The patient's medications were reviewed and reconciled since the patient's last visit.  History details were provided by the patient.  The history appears to be reliable.    Medical History: Patient history and Family history was reviewed  Medications, Allergies, and preventative health maintenance was reviewed and updated.   Visit Complete: Virtual I connected with  [name] on 02/03/24 by a audio enabled telemedicine application and verified that I am speaking with the correct person using two identifiers.  Patient Location: Home  Provider Location: Office/Clinic  I discussed the limitations of evaluation and management by telemedicine. The patient expressed understanding and agreed to proceed.  Vital Signs: Because this visit was a virtual/telehealth visit, some criteria may be missing or patient reported. Any vitals not documented were not able to be obtained and vitals that have been documented are patient reported.   Cardiac Risk Factors include: advanced age (>7men, >33 women);male gender     Objective:    Today's Vitals   02/03/24 1036  PainSc: 3   Patient was unable to self-report due to a lack of equipment at home via telehealth  There is no height or weight on file to calculate BMI.     01/30/2023    9:12 AM 01/18/2022    9:06 AM 06/26/2021    1:17 PM 06/23/2020    8:18 AM 06/10/2012    3:06 PM 06/10/2012    8:41 AM 06/05/2012    8:59 AM  Advanced Directives  Does Patient Have a Medical Advance Directive? No No No No Patient does not have advance directive;Patient would not like information   Patient does not have advance directive   Would patient like information on creating a medical advance directive? No - Patient declined  Yes (MAU/Ambulatory/Procedural Areas - Information given)       Pre-existing out of facility DNR order (yellow form or pink MOST form)     No  No  No      Data saved with a previous flowsheet row definition    Current Medications (verified) Outpatient Encounter Medications as of 02/03/2024  Medication Sig   atorvastatin  (LIPITOR) 40 MG tablet Take 1 tablet (40 mg total) by mouth daily.   methocarbamol  (ROBAXIN ) 500 MG tablet Take 1 tablet 3 times a day by oral route as needed for 30 days.   testosterone  cypionate (DEPOTESTOSTERONE CYPIONATE) 200 MG/ML injection Inject 1 mL (200 mg total) into the muscle every 21 ( twenty-one) days.   zolpidem  (AMBIEN ) 10 MG tablet Take 1 tablet (10 mg total) by mouth at bedtime as needed for sleep.   [DISCONTINUED] albuterol  (VENTOLIN  HFA) 108 (90 Base) MCG/ACT inhaler SMARTSIG:1 Inhalation Via Inhaler Every 4 Hours   [DISCONTINUED] sildenafil  (VIAGRA ) 100 MG tablet Take 1 tablet (100 mg total) by mouth daily as needed for erectile dysfunction.   No facility-administered encounter medications on file as of 02/03/2024.    Allergies (verified) Gramineae pollens, Meloxicam, Other, Penicillins, and Tizanidine   History: Past Medical History:  Diagnosis Date   Asthma    Coronavirus infection 07/15/2019   Formatting of this note might be different from the original. Notification of POS result Isolation since day of testing of patient and family members Local Health Dept will call with specific instructions Conv Care their POC until the [facility] contacts them Obtaining  testing for symptomatic Family Members Discuss self-isolation measures Discuss in-home separation and safety measures Discuss web-based C   Insomnia    Kidney stones 13 yrs ago   Mixed hyperlipidemia    Other emphysema (HCC)    PONV (postoperative nausea and vomiting)    Primary insomnia    Testicular hypofunction    Past Surgical History:  Procedure Laterality Date   bilateral rotator cuff repair  right 2004, left 2009   left ankle fx x 2      lithotripsy Right    SHOULDER ARTHROSCOPY WITH OPEN ROTATOR CUFF REPAIR AND DISTAL CLAVICLE ACROMINECTOMY  06/10/2012   Procedure: SHOULDER ARTHROSCOPY WITH OPEN ROTATOR CUFF REPAIR AND DISTAL CLAVICLE ACROMINECTOMY;  Surgeon: Lynwood SHAUNNA Bern, MD;  Location: [facility];  Service: Orthopedics;  Laterality: Right;  LABRAL DEBRIDEMENT OPEN ANTERIOR ACROMINECTOMY/ROTATOR CUFF REPAIR with anchor   Family History  Problem Relation Age of Onset   Lymphoma Brother    Social History   Socioeconomic History   Marital status: Married    Spouse name: [name]   Number of children: 1   Years of education: 16   Highest education level: Master's degree (e.g., MA, MS, MEng, MEd, MSW, MBA)  Occupational History    Comment: retired Paramedic works  Tobacco Use   Smoking status: Former    Current packs/day: 1.00    Average packs/day: 1 pack/day for 2.0 years (2.0 ttl pk-yrs)    Types: Cigarettes   Smokeless tobacco: Former    Types: Chew, Snuff   Tobacco comments:    quit smoking 40 yrs ago  Vaping Use   Vaping status: Never Used  Substance and Sexual Activity   Alcohol use: Yes    Comment: occasionally   Drug use: Yes    Types: Marijuana   Sexual activity: Yes  Other Topics Concern   Not on file  Social History Narrative   Not on file   Social Drivers of Health   Financial Resource Strain: Low Risk  (02/03/2024)   Overall Financial Resource Strain (CARDIA)    Difficulty of Paying Living Expenses: Not hard at all  Food Insecurity: No Food Insecurity (02/03/2024)   Hunger Vital Sign    Worried About Running Out of Food in the Last Year: Never true    Ran Out of Food in the Last Year: Never true  Transportation Needs: No Transportation Needs (02/03/2024)   PRAPARE - Administrator, Civil Service (Medical): No    Lack of Transportation (Non-Medical): No  Physical Activity: Sufficiently Active (02/03/2024)   Exercise Vital Sign    Days of Exercise per Week: 7 days    Minutes of  Exercise per Session: 30 min  Stress: No Stress Concern Present (02/03/2024)   [employer] of Occupational Health - Occupational Stress Questionnaire    Feeling of Stress: Not at all  Social Connections: Socially Integrated (02/03/2024)   Social Connection and Isolation Panel    Frequency of Communication with Friends and Family: More than three times a week    Frequency of Social Gatherings with Friends and Family: More than three times a week    Attends Religious Services: More than 4 times per year    Active Member of [employer] or Organizations: Yes    Attends Engineer, structural: More than 4 times per year    Marital Status: Married    Tobacco Counseling Counseling given: Not Answered Tobacco comments: quit smoking 40 yrs ago   Clinical Intake:  Pre-visit  preparation completed: Yes Pain : 0-10 Pain Score: 3  Pain Type: Acute pain Pain Location: Knee Pain Descriptors / Indicators: Aching Pain Frequency: Intermittent Pain Relieving Factors: got injection from [facility]  Nutritional Risks: None Diabetes: No How often do you need to have someone help you when you read instructions, pamphlets, or other written materials from your doctor or pharmacy?: 1 - Never Interpreter Needed?: No    Activities of Daily Living    02/03/2024   10:47 AM  In your present state of health, do you have any difficulty performing the following activities:  Hearing? 0  Vision? 0  Difficulty concentrating or making decisions? 0  Walking or climbing stairs? 0  Dressing or bathing? 0  Doing errands, shopping? 0  Preparing Food and eating ? N  Using the Toilet? N  In the past six months, have you accidently leaked urine? N  Do you have problems with loss of bowel control? N  Managing your Medications? N  Managing your Finances? N  Housekeeping or managing your Housekeeping? N    Patient Care Team: Milon Cleaves, [place] as PCP - General (Physician Assistant) Maurilio Camellia PARAS, MD as  Consulting Physician (Allergy and Immunology)  Indicate any recent Medical Services you may have received from other than [facility] providers in the past year (date may be approximate). [facility]    Assessment:   This is a routine wellness examination for [facility].  Depression Screen    02/03/2024   10:45 AM 08/13/2023    7:35 AM 01/30/2023    9:11 AM 01/03/2023    7:34 AM 07/03/2022    7:35 AM 06/26/2021    1:16 PM 12/25/2020    7:38 AM  PHQ 2/9 Scores  PHQ - 2 Score 0 0 0 0 0 0 0  PHQ- 9 Score  0   2      Fall Risk    02/03/2024   10:47 AM 08/13/2023    7:34 AM 01/30/2023    9:11 AM 01/03/2023    7:34 AM 12/26/2021    8:01 AM  Fall Risk   Falls in the past year? 0 0 0 0 0  Number falls in past yr: 0 0 0 0 0  Injury with Fall? 0 0 0 0 0  Risk for fall due to : No Fall Risks No Fall Risks No Fall Risks No Fall Risks No Fall Risks  Follow up Falls evaluation completed;Education provided Falls evaluation completed Falls prevention discussed Falls evaluation completed Falls evaluation completed      Data saved with a previous flowsheet row definition    MEDICARE RISK AT HOME: Medicare Risk at Home Any stairs in or around the home?: No If so, are there any without handrails?: No Home free of loose throw rugs in walkways, pet beds, electrical cords, etc?: Yes Adequate lighting in your home to reduce risk of falls?: Yes Life alert?: No Use of a cane, walker or w/c?: No Grab bars in the bathroom?: No Shower chair or bench in shower?: No Elevated toilet seat or a handicapped toilet?: No  TIMED UP AND GO:  Was the test performed?  No    Cognitive Function:        02/03/2024   10:48 AM 01/30/2023    9:12 AM 06/26/2021    1:29 PM 06/23/2020    8:22 AM  6CIT Screen  What Year? 0 points 0 points 0 points 0 points  What month? 0 points 0 points 0 points  0 points  What time? 0 points 0 points 0 points 0 points  Count back from 20 0 points 0 points 0 points 0 points  Months in  reverse 0 points 0 points 0 points 0 points  Repeat phrase 0 points 0 points 0 points 0 points  Total Score 0 points 0 points 0 points 0 points    Immunizations Immunization History  Administered Date(s) Administered   Fluad Quad(high Dose 65+) 05/01/2021, 04/22/2022   Fluad Trivalent(High Dose 65+) 05/13/2023   Influenza-Unspecified 04/16/2018, 05/18/2019   Moderna Sars-Covid-2 Vaccination 08/27/2019, 09/22/2019, 07/11/2020   Pneumococcal Conjugate-13 06/09/2017   Pneumococcal Polysaccharide-23 12/25/2020   Tdap 11/15/2021   Zoster Recombinant(Shingrix) 11/15/2021, 02/12/2022    TDAP status: Up to date  Flu Vaccine status: Up to date  Pneumococcal vaccine status: Up to date  Covid-19 vaccine status: Declined, Education has been provided regarding the importance of this vaccine but patient still declined. Advised may receive this vaccine at local pharmacy or Health Dept.or vaccine clinic. Aware to provide a copy of the vaccination record if obtained from local pharmacy or Health Dept. Verbalized acceptance and understanding.  Qualifies for Shingles Vaccine? Yes   Zostavax completed No   Shingrix Completed?: Yes  Screening Tests Health Maintenance  Topic Date Due   Colonoscopy  09/26/2022   Medicare Annual Wellness (AWV)  01/30/2024   INFLUENZA VACCINE  02/13/2024   DTaP/Tdap/Td (2 - Td or Tdap) 11/16/2031   Pneumococcal Vaccine: 50+ Years  Completed   Hepatitis C Screening  Completed   Zoster Vaccines- Shingrix  Completed   Hepatitis B Vaccines  Aged Out   HPV VACCINES  Aged Out   Meningococcal B Vaccine  Aged Out   COVID-19 Vaccine  Discontinued    Health Maintenance  Health Maintenance Due  Topic Date Due   Colonoscopy  09/26/2022   Medicare Annual Wellness (AWV)  01/30/2024    Colorectal cancer screening: Type of screening: Colonoscopy. Completed 2014  Lung Cancer Screening: (Low Dose CT Chest recommended if Age 65-80 years, 20 pack-year currently smoking OR  have quit w/in 15years.) does not qualify.   Lung Cancer Screening Referral: N/A  Additional Screening:  Vision Screening: Recommended annual ophthalmology exams for early detection of glaucoma and other disorders of the eye. Is the patient up to date with their annual eye exam?  Yes   Dental Screening: Recommended annual dental exams for proper oral hygiene  Community Resource Referral / Chronic Care Management: CRR required this visit?  No   CCM required this visit?  No     Plan:    1- Referral made to Dr Larene so he can schedule his colonoscopy 2- Flu shot due in the fall 3- Patient is currently being treated by [facility] for knee pain  I have personally reviewed and noted the following in the patient's chart:   Medical and social history Use of alcohol, tobacco or illicit drugs  Current medications and supplements including opioid prescriptions. Patient is not currently taking opioid prescriptions. Functional ability and status Nutritional status Physical activity Advanced directives List of other physicians Hospitalizations, surgeries, and ER visits in previous 12 months Vitals Screenings to include cognitive, depression, and falls Referrals and appointments  In addition, I have reviewed and discussed with patient certain preventive protocols, quality metrics, and best practice recommendations. A written personalized care plan for preventive services as well as general preventive health recommendations were provided to patient.     Thomas CHRISTELLA Sharps, LPN   [id number]  After Visit Summary: (Mail) Due to this being a telephonic visit, the after visit summary with patients personalized plan was offered to patient via mail

## 2024-02-10 ENCOUNTER — Ambulatory Visit (INDEPENDENT_AMBULATORY_CARE_PROVIDER_SITE_OTHER): Payer: Medicare HMO | Admitting: Physician Assistant

## 2024-02-10 ENCOUNTER — Encounter: Payer: Self-pay | Admitting: Physician Assistant

## 2024-02-10 VITALS — BP 136/78 | HR 71 | Temp 98.3°F | Ht 73.0 in | Wt 218.0 lb

## 2024-02-10 DIAGNOSIS — Z131 Encounter for screening for diabetes mellitus: Secondary | ICD-10-CM | POA: Diagnosis not present

## 2024-02-10 DIAGNOSIS — F5101 Primary insomnia: Secondary | ICD-10-CM

## 2024-02-10 DIAGNOSIS — E782 Mixed hyperlipidemia: Secondary | ICD-10-CM

## 2024-02-10 DIAGNOSIS — Z1211 Encounter for screening for malignant neoplasm of colon: Secondary | ICD-10-CM | POA: Insufficient documentation

## 2024-02-10 DIAGNOSIS — R7303 Prediabetes: Secondary | ICD-10-CM | POA: Insufficient documentation

## 2024-02-10 DIAGNOSIS — M25562 Pain in left knee: Secondary | ICD-10-CM | POA: Diagnosis not present

## 2024-02-10 DIAGNOSIS — K219 Gastro-esophageal reflux disease without esophagitis: Secondary | ICD-10-CM

## 2024-02-10 DIAGNOSIS — E291 Testicular hypofunction: Secondary | ICD-10-CM

## 2024-02-10 MED ORDER — TESTOSTERONE CYPIONATE 200 MG/ML IM SOLN: 200.0000 mg | INTRAMUSCULAR | 2 refills | Status: DC

## 2024-02-10 NOTE — Patient Instructions (Signed)
 VISIT SUMMARY:  Today, you came in for a follow-up visit to discuss your knee pain, shoulder discomfort, and other health concerns. We reviewed your current symptoms and made some adjustments to your treatment plan to help manage your conditions more effectively.  YOUR PLAN:  -RIGHT KNEE PAIN: Your knee pain is likely due to chronic issues that are worsened by certain exercises. To help manage this, avoid leg extensions and heavy leg presses. Instead, focus on light weight exercises that allow a full range of motion without causing pain. Modified physical activity is encouraged to prevent further injury.  -LEFT SHOULDER PAIN WITH ROTATOR CUFF INJURY: Your shoulder pain is likely related to a previous rotator cuff injury and is aggravated by heavy lifting. To manage this, avoid heavy lifting and instead do light weight exercises with higher repetitions.  -MUSCLE CRAMPS LIKELY RELATED TO DEHYDRATION: Your muscle cramps are likely due to dehydration. To manage this, make sure to stay adequately hydrated, especially during hot weather and physical activity. Continue drinking electrolyte solutions as needed.  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 We are adjusting your testosterone  replacement to 300 mg every 14 days and will recheck your levels in 6 weeks.  -PRE-DIABETES: Your previous A1c test showed slightly elevated blood sugar levels. We will order another A1c test to monitor your glucose levels.  -HYPERLIPIDEMIA ON STATIN THERAPY: Your cholesterol levels are being managed with statin therapy, and you have not reported any muscle pain. Continue with your current statin therapy, and we will monitor your cholesterol levels.  -BENIGN PROSTATIC HYPERPLASIA WITH LOWER URINARY TRACT SYMPTOMS: You have symptoms of frequent urination and incomplete bladder emptying, which are common with an enlarged prostate. We discussed your PSA  testing schedule, and your next test is due at the start of the year.  -GASTROESOPHAGEAL REFLUX DISEASE: Your acid reflux symptoms are being managed by dietary modifications. Continue with these dietary changes to help control your symptoms.  INSTRUCTIONS:  We will recheck your testosterone  levels in 6 weeks. Additionally, we will order an A1c test to monitor your glucose levels. Your next PSA test is due at the start of the year. Please follow the exercise and dietary recommendations provided to help manage your symptoms.

## 2024-02-10 NOTE — Assessment & Plan Note (Signed)
 Managed with statin therapy, no muscle pain reported. - Continue current lipitor 40mg  - Monitor cholesterol levels. -Labs drawn today - Will adjust based on results

## 2024-02-10 NOTE — Assessment & Plan Note (Signed)
 Continually low Increased frequency to every 14 days Recheck testosterone  in 6 weeks to make sure it is not continually low

## 2024-02-10 NOTE — Assessment & Plan Note (Signed)
 Controlled Continue to take Ambien  10mg  as prescribed  Denies any new or worsening side effects

## 2024-02-10 NOTE — Assessment & Plan Note (Signed)
 Chronic pain exacerbated by certain exercises. - Advise avoiding leg extensions and heavy leg press. - Recommend light weight exercises with full range of motion without pain. - Encourage modified physical activity to prevent injury.
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
## 2024-02-10 NOTE — Assessment & Plan Note (Signed)
 Controlled Continue to monitor symptoms Continue to manage with diet and exercise Will adjust treatment depending on symptoms

## 2024-02-10 NOTE — Assessment & Plan Note (Signed)
 Previous A1c slightly elevated at 5.8%. - Order A1c test to monitor glucose levels. Lab Results  Component Value Date   HGBA1C 5.8 (H) 08/13/2023

## 2024-02-11 LAB — CBC WITH DIFFERENTIAL/PLATELET
Basophils Absolute: 0.1 x10E3/uL (ref 0.0–0.2)
Basos: 1 %
EOS (ABSOLUTE): 0.2 x10E3/uL (ref 0.0–0.4)
Eos: 2 %
Hematocrit: 51.7 % — ABNORMAL HIGH (ref 37.5–51.0)
Hemoglobin: 17 g/dL (ref 13.0–17.7)
Immature Grans (Abs): 0.2 x10E3/uL — ABNORMAL HIGH (ref 0.0–0.1)
Immature Granulocytes: 3 %
Lymphocytes Absolute: 2.1 x10E3/uL (ref 0.7–3.1)
Lymphs: 23 %
MCH: 31.3 pg (ref 26.6–33.0)
MCHC: 32.9 g/dL (ref 31.5–35.7)
MCV: 95 fL (ref 79–97)
Monocytes Absolute: 0.8 x10E3/uL (ref 0.1–0.9)
Monocytes: 9 %
Neutrophils Absolute: 5.8 x10E3/uL (ref 1.4–7.0)
Neutrophils: 62 %
Platelets: 310 x10E3/uL (ref 150–450)
RBC: 5.43 x10E6/uL (ref 4.14–5.80)
RDW: 13 % (ref 11.6–15.4)
WBC: 9.3 x10E3/uL (ref 3.4–10.8)

## 2024-02-11 LAB — COMPREHENSIVE METABOLIC PANEL WITH GFR
ALT: 29 IU/L (ref 0–44)
AST: 27 IU/L (ref 0–40)
Albumin: 4.5 g/dL (ref 3.8–4.8)
Alkaline Phosphatase: 114 IU/L (ref 44–121)
BUN/Creatinine Ratio: 15 (ref 10–24)
BUN: 19 mg/dL (ref 8–27)
Bilirubin Total: 1.1 mg/dL (ref 0.0–1.2)
CO2: 22 mmol/L (ref 20–29)
Calcium: 9.3 mg/dL (ref 8.6–10.2)
Chloride: 98 mmol/L (ref 96–106)
Creatinine, Ser: 1.29 mg/dL — ABNORMAL HIGH (ref 0.76–1.27)
Globulin, Total: 2.7 g/dL (ref 1.5–4.5)
Glucose: 89 mg/dL (ref 70–99)
Potassium: 4.8 mmol/L (ref 3.5–5.2)
Sodium: 134 mmol/L (ref 134–144)
Total Protein: 7.2 g/dL (ref 6.0–8.5)
eGFR: 59 mL/min/1.73 — ABNORMAL LOW (ref >59)

## 2024-02-11 LAB — HEMOGLOBIN A1C
Est. average glucose Bld gHb Est-mCnc: 105 mg/dL
Hgb A1c MFr Bld: 5.3 % (ref 4.8–5.6)

## 2024-02-11 LAB — LIPID PANEL
Chol/HDL Ratio: 2.1 ratio (ref 0.0–5.0)
Cholesterol, Total: 116 mg/dL (ref 100–199)
HDL: 55 mg/dL (ref >39)
LDL Chol Calc (NIH): 46 mg/dL (ref 0–99)
Triglycerides: 70 mg/dL (ref 0–149)
VLDL Cholesterol Cal: 15 mg/dL (ref 5–40)

## 2024-02-11 LAB — TESTOSTERONE,FREE AND TOTAL
Testosterone, Free: 1.4 pg/mL — ABNORMAL LOW (ref 6.6–18.1)
Testosterone: 185 ng/dL — ABNORMAL LOW (ref 264–916)

## 2024-02-13 ENCOUNTER — Ambulatory Visit: Payer: Self-pay | Admitting: Physician Assistant

## 2024-02-17 ENCOUNTER — Telehealth: Payer: Self-pay
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
## 2024-02-19 NOTE — Telephone Encounter (Signed)
 Patient informed.

## 2024-03-18 ENCOUNTER — Telehealth: Payer: Self-pay | Admitting: Physician Assistant

## 2024-03-18 NOTE — Telephone Encounter (Signed)
 [name] Request for [employer]

## 2024-03-23 ENCOUNTER — Other Ambulatory Visit

## 2024-03-23 DIAGNOSIS — E291 Testicular hypofunction: Secondary | ICD-10-CM

## 2024-03-24 DIAGNOSIS — M5416 Radiculopathy, lumbar region: Secondary | ICD-10-CM | POA: Diagnosis not present

## 2024-03-24 DIAGNOSIS — Z5181 Encounter for therapeutic drug level monitoring: Secondary | ICD-10-CM | POA: Diagnosis not present

## 2024-03-28 ENCOUNTER — Ambulatory Visit: Payer: Self-pay

## 2024-03-28 LAB — TESTOSTERONE,FREE AND TOTAL
Testosterone, Free: 4.1 pg/mL — ABNORMAL LOW (ref 6.6–18.1)
Testosterone: 464 ng/dL (ref 264–916)

## 2024-04-01 ENCOUNTER — Telehealth: Payer: Self-pay | Admitting: Physician Assistant

## 2024-04-01 ENCOUNTER — Other Ambulatory Visit: Payer: Self-pay

## 2024-04-01 DIAGNOSIS — E782 Mixed hyperlipidemia: Secondary | ICD-10-CM

## 2024-04-01 MED ORDER — ATORVASTATIN CALCIUM 40 MG PO TABS: 40.0000 mg | ORAL_TABLET | Freq: Every day | ORAL | 0 refills | Status: DC

## 2024-04-01 NOTE — Telephone Encounter (Unsigned)
 Copied from CRM #[id number]. Topic: Clinical - Medication Refill >> Apr 01, 2024 11:26 AM Emylou G wrote: Medication: atorvastatin  (LIPITOR) 40 MG tablet  Has the patient contacted their pharmacy? No (Agent: If no, request that the patient contact the pharmacy for the refill. If patient does not wish to contact the pharmacy document the reason why and proceed with request.) (Agent: If yes, when and what did the pharmacy advise?)  This is the patient's preferred pharmacy:  [address] [facility], [place] - [address] [place] [address] Phone: [phone] Fax: [phone]   Is this the correct pharmacy for this prescription? Yes If no, delete pharmacy and type the correct one.   Has the prescription been filled recently? No  Is the patient out of the medication? Yes  Has the patient been seen for an appointment in the last year OR does the patient have an upcoming appointment? Yes  Can we respond through MyChart? No  Agent: Please be advised that Rx refills may take up to 3 business days. We ask that you follow-up with your pharmacy.

## 2024-04-19 ENCOUNTER — Other Ambulatory Visit: Payer: Self-pay | Admitting: Physician Assistant

## 2024-04-19 DIAGNOSIS — F5101 Primary insomnia: Secondary | ICD-10-CM

## 2024-05-03 DIAGNOSIS — M75102 Unspecified rotator cuff tear or rupture of left shoulder, not specified as traumatic: Secondary | ICD-10-CM | POA: Diagnosis not present

## 2024-05-03 DIAGNOSIS — M19012 Primary osteoarthritis, left shoulder: Secondary | ICD-10-CM | POA: Diagnosis not present

## 2024-05-06 DIAGNOSIS — D225 Melanocytic nevi of trunk: Secondary | ICD-10-CM | POA: Diagnosis not present

## 2024-05-06 DIAGNOSIS — L570 Actinic keratosis: Secondary | ICD-10-CM | POA: Diagnosis not present

## 2024-05-06 DIAGNOSIS — L814 Other melanin hyperpigmentation: Secondary | ICD-10-CM | POA: Diagnosis not present

## 2024-05-06 DIAGNOSIS — L821 Other seborrheic keratosis: Secondary | ICD-10-CM | POA: Diagnosis not present

## 2024-05-06 DIAGNOSIS — D2239 Melanocytic nevi of other parts of face: Secondary | ICD-10-CM | POA: Diagnosis not present

## 2024-07-23 ENCOUNTER — Other Ambulatory Visit: Payer: Self-pay | Admitting: Physician Assistant

## 2024-07-23 DIAGNOSIS — E782 Mixed hyperlipidemia: Secondary | ICD-10-CM

## 2024-08-04 ENCOUNTER — Other Ambulatory Visit: Payer: Self-pay

## 2024-08-04 DIAGNOSIS — F5101 Primary insomnia: Secondary | ICD-10-CM

## 2024-08-04 NOTE — Telephone Encounter (Signed)
 Copied from CRM [id number]. Topic: Clinical - Medication Refill >> Aug 04, 2024  4:36 PM Wess RAMAN wrote: Medication: zolpidem  (AMBIEN ) 10 MG tablet   Has the patient contacted their pharmacy? No (Agent: If no, request that the patient contact the pharmacy for the refill. If patient does not wish to contact the pharmacy document the reason why and proceed with request.) (Agent: If yes, when and what did the pharmacy advise?)  This is the patient's preferred pharmacy:  [address] [facility], [place] - [address] [place] [address] Phone: [phone] Fax: [phone]  Is this the correct pharmacy for this prescription? Yes If no, delete pharmacy and type the correct one.   Has the prescription been filled recently? No  Is the patient out of the medication? Yes  Has the patient been seen for an appointment in the last year OR does the patient have an upcoming appointment? Yes  Can we respond through MyChart? Yes  Agent: Please be advised that Rx refills may take up to 3 business days. We ask that you follow-up with your pharmacy.

## 2024-08-06 ENCOUNTER — Other Ambulatory Visit: Payer: Self-pay

## 2024-08-06 DIAGNOSIS — F5101 Primary insomnia: Secondary | ICD-10-CM

## 2024-08-06 MED ORDER — ZOLPIDEM TARTRATE 10 MG PO TABS: 10.0000 mg | ORAL_TABLET | Freq: Every evening | ORAL | 0 refills | Status: AC | PRN

## 2024-08-11 NOTE — Progress Notes (Signed)
 "  Subjective:  Patient ID: [name], male    DOB: 1950-09-14  Age: 74 y.o. MRN: [id number]  No chief complaint on file.   HPI: Discussed the use of AI scribe software for clinical note transcription with the patient, who gave verbal consent to proceed.  History of Present Illness [name] is a 74 year old male who presents for a chronic follow-up visit.  He has not heard back from the gastroenterologist despite sending in the required paperwork and providing his cell phone number. He rarely answers his home phone, which may have contributed to the missed communication.  Two weeks ago, he had a cold lasting about a week, characterized by a significant cough. He has experienced worse colds in the past.  He received an injection in his right shoulder. However, he continues to experience discomfort in his left shoulder due to a torn rotator cuff and two torn biceps. Despite this, he maintains an active lifestyle, working out three days a week and walking daily.  He experiences nocturia, getting up two to three times a night to urinate, but he is able to return to sleep easily. He has started taking a supplement from [employer] to help with this issue. No accidents or severe urgency.  He rarely uses Ambien , taking half a pill about once a month. He typically sleeps from 10:30 PM to 6:30 AM, achieving about eight hours of sleep.  An increase in his testosterone  dose has helped stabilize his energy levels, preventing the severe crashes he experienced previously. He describes the effect as a more gradual dip rather than a sharp decline.  He has lost some weight, currently weighing 214 pounds, down from over 230 pounds. He attributes this to his efforts to maintain his weight between 215 and 218 pounds.          02/03/2024   10:45 AM 08/13/2023    7:35 AM 01/30/2023    9:11 AM 01/03/2023    7:34 AM 07/03/2022    7:35 AM  Depression screen PHQ 2/9  Decreased  Interest 0 0 0 0 0  Down, Depressed, Hopeless 0 0 0 0 0  PHQ - 2 Score 0 0 0 0 0  Altered sleeping  0   2  Tired, decreased energy  0   0  Change in appetite  0   0  Feeling bad or failure about yourself   0   0  Trouble concentrating  0   0  Moving slowly or fidgety/restless  0   0  Suicidal thoughts  0   0  PHQ-9 Score  0    2   Difficult doing work/chores  Not difficult at all   Not difficult at all     Data saved with a previous flowsheet row definition        02/03/2024   10:47 AM  Fall Risk   Falls in the past year? 0  Number falls in past yr: 0  Injury with Fall? 0   Risk for fall due to : No Fall Risks  Follow up Falls evaluation completed;Education provided     Data saved with a previous flowsheet row definition    Patient Care Team: Milon Cleaves, [place] as PCP - General (Physician Assistant) Maurilio, Camellia JINNY, MD as Consulting Physician (Allergy and Immunology)   Review of Systems  Constitutional:  Negative for appetite change, fatigue and fever.  HENT:  Negative for congestion, ear pain, sinus pressure  and sore throat.   Respiratory:  Negative for cough, chest tightness, shortness of breath and wheezing.   Cardiovascular:  Negative for chest pain and palpitations.  Gastrointestinal:  Negative for abdominal pain, constipation, diarrhea, nausea and vomiting.  Genitourinary:  Negative for dysuria and hematuria.  Musculoskeletal:  Negative for arthralgias, back pain, joint swelling and myalgias.  Skin:  Negative for rash.  Neurological:  Negative for dizziness, weakness and headaches.  Psychiatric/Behavioral:  Negative for dysphoric mood. The patient is not nervous/anxious.     Medications Ordered Prior to Encounter[1] Past Medical History:  Diagnosis Date   Asthma    Coronavirus infection 07/15/2019   Formatting of this note might be different from the original. Notification of POS result Isolation since day of testing of patient and family members Local [facility] Dept  will call with specific instructions Conv Care their POC until the [facility] contacts them Obtaining testing for symptomatic Family Members Discuss self-isolation measures Discuss in-home separation and safety measures Discuss web-based C   Insomnia    Kidney stones 13 yrs ago   Mixed hyperlipidemia    Other emphysema (HCC)    PONV (postoperative nausea and vomiting)    Primary insomnia    Testicular hypofunction    Past Surgical History:  Procedure Laterality Date   bilateral rotator cuff repair  right 2004, left 2009   left ankle fx x 2     lithotripsy Right    SHOULDER ARTHROSCOPY WITH OPEN ROTATOR CUFF REPAIR AND DISTAL CLAVICLE ACROMINECTOMY  06/10/2012   Procedure: SHOULDER ARTHROSCOPY WITH OPEN ROTATOR CUFF REPAIR AND DISTAL CLAVICLE ACROMINECTOMY;  Surgeon: Lynwood SHAUNNA Bern, MD;  Location: [facility];  Service: Orthopedics;  Laterality: Right;  LABRAL DEBRIDEMENT OPEN ANTERIOR ACROMINECTOMY/ROTATOR CUFF REPAIR with anchor    Family History  Problem Relation Age of Onset   Lymphoma Brother    Social History   Socioeconomic History   Marital status: Married    Spouse name: Slater   Number of children: 1   Years of education: 16   Highest education level: Bachelor's degree (e.g., BA, AB, BS)  Occupational History    Comment: retired paramedic works  Tobacco Use   Smoking status: Former    Current packs/day: 1.00    Average packs/day: 1 pack/day for 2.0 years (2.0 ttl pk-yrs)    Types: Cigarettes   Smokeless tobacco: Former    Types: Chew, Snuff   Tobacco comments:    quit smoking 40 yrs ago  Vaping Use   Vaping status: Never Used  Substance and Sexual Activity   Alcohol use: Yes    Comment: occasionally   Drug use: Yes    Types: Marijuana   Sexual activity: Yes  Other Topics Concern   Not on file  Social History Narrative   Not on file   Social Drivers of Health   Tobacco Use: Medium Risk (02/10/2024)   Patient History    Smoking Tobacco Use: Former    Smokeless  Tobacco Use: Former    Passive Exposure: Not on Actuary Strain: Low Risk (08/08/2024)   Overall Financial Resource Strain (CARDIA)    Difficulty of Paying Living Expenses: Not hard at all  Food Insecurity: No Food Insecurity (08/08/2024)   Epic    Worried About Radiation Protection Practitioner of Food in the Last Year: Never true    Ran Out of Food in the Last Year: Never true  Transportation Needs: No Transportation Needs (08/08/2024)   Epic    Lack of  Transportation (Medical): No    Lack of Transportation (Non-Medical): No  Physical Activity: Sufficiently Active (08/08/2024)   Exercise Vital Sign    Days of Exercise per Week: 4 days    Minutes of Exercise per Session: 70 min  Stress: No Stress Concern Present (08/08/2024)   [employer] of Occupational Health - Occupational Stress Questionnaire    Feeling of Stress: Not at all  Social Connections: Unknown (08/08/2024)   Social Connection and Isolation Panel    Frequency of Communication with Friends and Family: More than three times a week    Frequency of Social Gatherings with Friends and Family: Patient declined    Attends Religious Services: Patient declined    Active Member of Clubs or Organizations: Patient declined    Attends Banker Meetings: Not on file    Marital Status: Married  Depression (PHQ2-9): Low Risk (02/03/2024)   Depression (PHQ2-9)    PHQ-2 Score: 0  Alcohol Screen: Low Risk (08/08/2024)   Alcohol Screen    Last Alcohol Screening Score (AUDIT): 1  Housing: Low Risk (08/08/2024)   Epic    Unable to Pay for Housing in the Last Year: No    Number of Times Moved in the Last Year: 0    Homeless in the Last Year: No  Utilities: Not At Risk (02/03/2024)   Epic    Threatened with loss of utilities: No  Health Literacy: Adequate Health Literacy (02/03/2024)   B1300 Health Literacy    Frequency of need for help with medical instructions: Never    Objective:  There were no vitals taken for this  visit.     02/10/2024    7:37 AM 08/13/2023    7:30 AM 01/30/2023    9:06 AM  BP/Weight  Systolic BP 136 120 --  Diastolic BP 78 64 --  Wt. (Lbs) 218 226 224  BMI 28.76 kg/m2 29.82 kg/m2 29.55 kg/m2    Physical Exam Vitals reviewed.  Constitutional:      Appearance: Normal appearance.  Cardiovascular:     Rate and Rhythm: Normal rate and regular rhythm.     Heart sounds: Normal heart sounds.  Pulmonary:     Effort: Pulmonary effort is normal.     Breath sounds: Normal breath sounds.  Abdominal:     General: Bowel sounds are normal.     Palpations: Abdomen is soft.     Tenderness: There is no abdominal tenderness.  Neurological:     Mental Status: He is alert and oriented to person, place, and time.  Psychiatric:        Mood and Affect: Mood normal.        Behavior: Behavior normal.       Lab Results  Component Value Date   WBC 9.3 02/10/2024   HGB 17.0 02/10/2024   HCT 51.7 (H) 02/10/2024   PLT 310 02/10/2024   GLUCOSE 89 02/10/2024   CHOL 116 02/10/2024   TRIG 70 02/10/2024   HDL 55 02/10/2024   LDLCALC 46 02/10/2024   ALT 29 02/10/2024   AST 27 02/10/2024   NA 134 02/10/2024   K 4.8 02/10/2024   CL 98 02/10/2024   CREATININE 1.29 (H) 02/10/2024   BUN 19 02/10/2024   CO2 22 02/10/2024   HGBA1C 5.3 02/10/2024    Results for orders placed or performed in visit on 03/23/24  Testosterone ,Free and Total   Collection Time: 03/23/24 11:50 AM  Result Value Ref Range   Testosterone  464 264 - 916  ng/dL   Testosterone , Free 4.1 (L) 6.6 - 18.1 pg/mL  .  Assessment & Plan:   Assessment & Plan Prediabetes Controlled  Continue to monitor diet and exercise Labs drawn today Will adjust treatment based on labs    Mixed hyperlipidemia Controlled Continue to monitor diet and exercise Labs drawn today Continue taking Lipitor 40mg  as prescribed Will adjust treatment based on results Orders:   Lipid panel   Comprehensive metabolic panel   CBC with  Differential/Platelet  Testicular hypofunction Managed with testosterone  therapy. Increased dose improved symptoms. - Continue current testosterone  therapy regimen. - Sent prescription to urgent healthcare pharmacy. Orders:   Testosterone ,Free and Total   testosterone  cypionate (DEPOTESTOSTERONE CYPIONATE) 200 MG/ML injection; Inject 1 mL (200 mg total) into the muscle every 14 (fourteen) days.  Gastroesophageal reflux disease, unspecified whether esophagitis present Controlled Continue to monitor symptoms Will adjust treatment based on symptoms    Primary insomnia Primary insomnia Managed with Zolpidem  as needed, used infrequently. - Continue Zolpidem  10 MG oral at bedtime as needed.    Screening for diabetes mellitus  Orders:   Hemoglobin A1c  Screening PSA (prostate specific antigen)  Orders:   PSA  General Health Maintenance Discussed colon cancer screening options. He prefers blood test. PSA testing agreed upon. - Ordered blood test for colon cancer markers. - Ordered PSA test.  There is no height or weight on file to calculate BMI.   No orders of the defined types were placed in this encounter.  No orders of the defined types were placed in this encounter.   Follow-up: No follow-ups on file.  An After Visit Summary was printed and given to the patient.    I,Lauren M Auman,acting as a neurosurgeon for [employer], [employer].,have documented all relevant documentation on the behalf of Nola Angles, PA,as directed by  Nola Angles, PA while in the presence of Nola Angles, [place].    Nola Angles, [place] [facility] [phone]     [1]  Current Outpatient Medications on File Prior to Visit  Medication Sig Dispense Refill   atorvastatin  (LIPITOR) 40 MG tablet TAKE 1 TABLET BY MOUTH ONCE DAILY. 100 tablet 0   methocarbamol  (ROBAXIN ) 500 MG tablet Take 1 tablet 3 times a day by oral route as needed for 30 days.     testosterone  cypionate (DEPOTESTOSTERONE CYPIONATE)  200 MG/ML injection Inject 1 mL (200 mg total) into the muscle every 14 (fourteen) days. 10 mL 2   zolpidem  (AMBIEN ) 10 MG tablet Take 1 tablet (10 mg total) by mouth at bedtime as needed. for sleep 15 tablet 0   No current facility-administered medications on file prior to visit.   "

## 2024-08-11 NOTE — Assessment & Plan Note (Addendum)
 Controlled Continue to monitor diet and exercise Labs drawn today Continue taking Lipitor 40mg  as prescribed Will adjust treatment based on results Orders:   Lipid panel   Comprehensive metabolic panel   CBC with Differential/Platelet

## 2024-08-11 NOTE — Assessment & Plan Note (Addendum)
 Primary insomnia Managed with Zolpidem  as needed, used infrequently. - Continue Zolpidem  10 MG oral at bedtime as needed.

## 2024-08-11 NOTE — Assessment & Plan Note (Addendum)
 Managed with testosterone  therapy. Increased dose improved symptoms. - Continue current testosterone  therapy regimen. - Sent prescription to urgent healthcare pharmacy. Orders:   Testosterone ,Free and Total   testosterone  cypionate (DEPOTESTOSTERONE CYPIONATE) 200 MG/ML injection; Inject 1 mL (200 mg total) into the muscle every 14 (fourteen) days.

## 2024-08-11 NOTE — Assessment & Plan Note (Addendum)
 Controlled Continue to monitor diet and exercise Labs drawn today Will adjust treatment based on labs

## 2024-08-11 NOTE — Assessment & Plan Note (Addendum)
 Controlled Continue to monitor symptoms Will adjust treatment based on symptoms

## 2024-08-12 ENCOUNTER — Ambulatory Visit: Admitting: Physician Assistant

## 2024-08-12 ENCOUNTER — Encounter: Payer: Self-pay | Admitting: Physician Assistant

## 2024-08-12 VITALS — BP 124/60 | HR 80 | Temp 97.6°F | Resp 16 | Ht 73.0 in | Wt 214.0 lb

## 2024-08-12 DIAGNOSIS — Z1211 Encounter for screening for malignant neoplasm of colon: Secondary | ICD-10-CM | POA: Diagnosis not present

## 2024-08-12 DIAGNOSIS — Z131 Encounter for screening for diabetes mellitus: Secondary | ICD-10-CM | POA: Diagnosis not present

## 2024-08-12 DIAGNOSIS — Z125 Encounter for screening for malignant neoplasm of prostate: Secondary | ICD-10-CM

## 2024-08-12 DIAGNOSIS — E782 Mixed hyperlipidemia: Secondary | ICD-10-CM

## 2024-08-12 DIAGNOSIS — F5101 Primary insomnia: Secondary | ICD-10-CM | POA: Diagnosis not present

## 2024-08-12 DIAGNOSIS — R7303 Prediabetes: Secondary | ICD-10-CM

## 2024-08-12 DIAGNOSIS — E291 Testicular hypofunction: Secondary | ICD-10-CM

## 2024-08-12 DIAGNOSIS — K219 Gastro-esophageal reflux disease without esophagitis: Secondary | ICD-10-CM | POA: Diagnosis not present

## 2024-08-12 MED ORDER — TESTOSTERONE CYPIONATE 200 MG/ML IM SOLN: 200.0000 mg | INTRAMUSCULAR | 0 refills | Status: AC

## 2024-08-12 NOTE — Assessment & Plan Note (Signed)
 Orders:    PSA

## 2024-08-12 NOTE — Assessment & Plan Note (Signed)
 Orders:    Hemoglobin A1c

## 2024-08-14 ENCOUNTER — Encounter: Payer: Self-pay | Admitting: Physician Assistant

## 2024-08-15 LAB — CBC WITH DIFFERENTIAL/PLATELET
Basophils Absolute: 0.1 10*3/uL (ref 0.0–0.2)
Basos: 1 %
EOS (ABSOLUTE): 0.3 10*3/uL (ref 0.0–0.4)
Eos: 3 %
Hematocrit: 53.5 % — ABNORMAL HIGH (ref 37.5–51.0)
Hemoglobin: 17.3 g/dL (ref 13.0–17.7)
Immature Grans (Abs): 0.2 10*3/uL — ABNORMAL HIGH (ref 0.0–0.1)
Immature Granulocytes: 2 %
Lymphocytes Absolute: 2.2 10*3/uL (ref 0.7–3.1)
Lymphs: 24 %
MCH: 30.5 pg (ref 26.6–33.0)
MCHC: 32.3 g/dL (ref 31.5–35.7)
MCV: 94 fL (ref 79–97)
Monocytes Absolute: 0.9 10*3/uL (ref 0.1–0.9)
Monocytes: 10 %
Neutrophils Absolute: 5.6 10*3/uL (ref 1.4–7.0)
Neutrophils: 60 %
Platelets: 332 10*3/uL (ref 150–450)
RBC: 5.68 x10E6/uL (ref 4.14–5.80)
RDW: 12.4 % (ref 11.6–15.4)
WBC: 9.2 10*3/uL (ref 3.4–10.8)

## 2024-08-15 LAB — COMPREHENSIVE METABOLIC PANEL WITH GFR
ALT: 30 [IU]/L (ref 0–44)
AST: 29 [IU]/L (ref 0–40)
Albumin: 4.6 g/dL (ref 3.8–4.8)
Alkaline Phosphatase: 138 [IU]/L — ABNORMAL HIGH (ref 47–123)
BUN/Creatinine Ratio: 15 (ref 10–24)
BUN: 17 mg/dL (ref 8–27)
Bilirubin Total: 0.8 mg/dL (ref 0.0–1.2)
CO2: 22 mmol/L (ref 20–29)
Calcium: 9.5 mg/dL (ref 8.6–10.2)
Chloride: 97 mmol/L (ref 96–106)
Creatinine, Ser: 1.14 mg/dL (ref 0.76–1.27)
Globulin, Total: 2.6 g/dL (ref 1.5–4.5)
Glucose: 98 mg/dL (ref 70–99)
Potassium: 4.4 mmol/L (ref 3.5–5.2)
Sodium: 137 mmol/L (ref 134–144)
Total Protein: 7.2 g/dL (ref 6.0–8.5)
eGFR: 68 mL/min/{1.73_m2}

## 2024-08-15 LAB — LIPID PANEL
Chol/HDL Ratio: 2.1 ratio (ref 0.0–5.0)
Cholesterol, Total: 111 mg/dL (ref 100–199)
HDL: 52 mg/dL
LDL Chol Calc (NIH): 47 mg/dL (ref 0–99)
Triglycerides: 51 mg/dL (ref 0–149)
VLDL Cholesterol Cal: 12 mg/dL (ref 5–40)

## 2024-08-15 LAB — PSA: Prostate Specific Ag, Serum: 4.8 ng/mL — ABNORMAL HIGH (ref 0.0–4.0)

## 2024-08-15 LAB — HEMOGLOBIN A1C
Est. average glucose Bld gHb Est-mCnc: 108 mg/dL
Hgb A1c MFr Bld: 5.4 % (ref 4.8–5.6)

## 2024-08-15 LAB — TESTOSTERONE,FREE AND TOTAL
Testosterone, Free: 7.1 pg/mL (ref 6.6–18.1)
Testosterone: 569 ng/dL (ref 264–916)

## 2024-08-16 ENCOUNTER — Ambulatory Visit: Payer: Self-pay | Admitting: Physician Assistant

## 2024-08-16 DIAGNOSIS — R972 Elevated prostate specific antigen [PSA]: Secondary | ICD-10-CM

## 2024-09-02 ENCOUNTER — Other Ambulatory Visit

## 2025-02-08 ENCOUNTER — Ambulatory Visit: Admitting: Physician Assistant

## 2025-02-11 ENCOUNTER — Ambulatory Visit: Admitting: Physician Assistant
